# Patient Record
Sex: Male | Born: 1985 | Race: Black or African American | Hispanic: No | Marital: Single | Smoking: Former smoker
Health system: Southern US, Community
[De-identification: ages and names within clinical notes are randomized; demographics above are authoritative.]

## PROBLEM LIST (undated history)

## (undated) DIAGNOSIS — J36 Peritonsillar abscess: Secondary | ICD-10-CM

---

## 2003-01-14 ENCOUNTER — Emergency Department (HOSPITAL_COMMUNITY): Admission: EM | Admit: 2003-01-14 | Discharge: 2003-01-14 | Payer: Self-pay | Admitting: *Deleted

## 2004-03-14 ENCOUNTER — Emergency Department (HOSPITAL_COMMUNITY): Admission: EM | Admit: 2004-03-14 | Discharge: 2004-03-14 | Payer: Self-pay | Admitting: Emergency Medicine

## 2004-03-17 ENCOUNTER — Emergency Department (HOSPITAL_COMMUNITY): Admission: EM | Admit: 2004-03-17 | Discharge: 2004-03-17 | Payer: Self-pay | Admitting: Emergency Medicine

## 2008-04-30 ENCOUNTER — Emergency Department (HOSPITAL_COMMUNITY): Admission: EM | Admit: 2008-04-30 | Discharge: 2008-04-30 | Payer: Self-pay | Admitting: Emergency Medicine

## 2010-12-27 ENCOUNTER — Emergency Department (HOSPITAL_COMMUNITY)
Admission: EM | Admit: 2010-12-27 | Discharge: 2010-12-27 | Disposition: A | Discharge status: Home / Self Care | Payer: Self-pay | Attending: Emergency Medicine | Admitting: Emergency Medicine

## 2010-12-27 DIAGNOSIS — A749 Chlamydial infection, unspecified: Secondary | ICD-10-CM | POA: Insufficient documentation

## 2010-12-27 DIAGNOSIS — A5400 Gonococcal infection of lower genitourinary tract, unspecified: Secondary | ICD-10-CM | POA: Insufficient documentation

## 2010-12-27 LAB — URINALYSIS, ROUTINE W REFLEX MICROSCOPIC
Glucose, UA: NEGATIVE mg/dL
Ketones, ur: NEGATIVE mg/dL
Specific Gravity, Urine: >1.03 — ABNORMAL HIGH (ref 1.005–1.030)
pH: 5.5 (ref 5.0–8.0)

## 2010-12-27 LAB — URINE MICROSCOPIC-ADD ON

## 2010-12-28 LAB — GC/CHLAMYDIA PROBE AMP, GENITAL
Chlamydia, DNA Probe: POSITIVE — AB
GC Probe Amp, Genital: POSITIVE — AB

## 2010-12-28 LAB — URINE CULTURE: Culture  Setup Time: 201206210121

## 2011-03-24 ENCOUNTER — Encounter: Payer: Self-pay | Admitting: *Deleted

## 2011-03-24 ENCOUNTER — Emergency Department (HOSPITAL_COMMUNITY)
Admission: EM | Admit: 2011-03-24 | Discharge: 2011-03-24 | Disposition: A | Discharge status: Home / Self Care | Payer: No Typology Code available for payment source | Attending: Emergency Medicine | Admitting: Emergency Medicine

## 2011-03-24 DIAGNOSIS — IMO0002 Reserved for concepts with insufficient information to code with codable children: Secondary | ICD-10-CM | POA: Insufficient documentation

## 2011-03-24 DIAGNOSIS — T07XXXA Unspecified multiple injuries, initial encounter: Secondary | ICD-10-CM

## 2011-03-24 MED ORDER — METHOCARBAMOL 500 MG PO TABS: ORAL_TABLET | ORAL | Status: DC

## 2011-03-24 MED ORDER — HYDROCODONE-ACETAMINOPHEN 5-500 MG PO TABS: ORAL_TABLET | ORAL | Status: DC

## 2011-03-24 NOTE — ED Notes (Signed)
PA in with pt at this time  
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
## 2011-03-26 NOTE — ED Provider Notes (Signed)
Medical screening examination/treatment/procedure(s) were performed by non-physician practitioner and as supervising physician I was immediately available for consultation/collaboration.   Jackline Castilla L Apolonio Cutting, MD 03/26/11 1804 

## 2011-04-10 LAB — RAPID STREP SCREEN (MED CTR MEBANE ONLY): Streptococcus, Group A Screen (Direct): NEGATIVE

## 2011-04-10 LAB — STREP A DNA PROBE: Group A Strep Probe: NEGATIVE

## 2015-05-23 ENCOUNTER — Emergency Department (HOSPITAL_COMMUNITY)
Admission: EM | Admit: 2015-05-23 | Discharge: 2015-05-23 | Disposition: A | Discharge status: Home / Self Care | Payer: Self-pay | Attending: Emergency Medicine | Admitting: Emergency Medicine

## 2015-05-23 ENCOUNTER — Encounter (HOSPITAL_COMMUNITY): Payer: Self-pay | Admitting: Emergency Medicine

## 2015-05-23 DIAGNOSIS — F17210 Nicotine dependence, cigarettes, uncomplicated: Secondary | ICD-10-CM | POA: Insufficient documentation

## 2015-05-23 DIAGNOSIS — J039 Acute tonsillitis, unspecified: Secondary | ICD-10-CM | POA: Insufficient documentation

## 2015-05-23 LAB — RAPID STREP SCREEN (MED CTR MEBANE ONLY): STREPTOCOCCUS, GROUP A SCREEN (DIRECT): NEGATIVE

## 2015-05-23 MED ORDER — ACETAMINOPHEN 500 MG PO TABS
ORAL_TABLET | ORAL | Status: AC
  Filled 2015-05-23: qty 2

## 2015-05-23 MED ORDER — HYDROCODONE-ACETAMINOPHEN 5-325 MG PO TABS
2.0000 | ORAL_TABLET | ORAL | Status: DC | PRN
Start: 2015-05-23 — End: 2019-04-17

## 2015-05-23 MED ORDER — CLINDAMYCIN PHOSPHATE 900 MG/50ML IV SOLN
900.0000 mg | Freq: Once | INTRAVENOUS | Status: AC
  Administered 2015-05-23: 900 mg via INTRAVENOUS
  Filled 2015-05-23: qty 50

## 2015-05-23 MED ORDER — CLINDAMYCIN HCL 300 MG PO CAPS: ORAL_CAPSULE | ORAL | Status: DC

## 2015-05-23 MED ORDER — SODIUM CHLORIDE 0.9 % IV SOLN
Freq: Once | INTRAVENOUS | Status: AC
  Administered 2015-05-23: 16:00:00 via INTRAVENOUS

## 2015-05-23 MED ORDER — ACETAMINOPHEN 500 MG PO TABS
1000.0000 mg | ORAL_TABLET | Freq: Once | ORAL | Status: AC
  Administered 2015-05-23: 1000 mg via ORAL

## 2015-05-23 NOTE — Discharge Instructions (Signed)

## 2015-05-23 NOTE — ED Notes (Signed)
Pt c/o sore throat and difficulty swallowing.

## 2015-05-23 NOTE — ED Provider Notes (Signed)
CSN: [id number]     Arrival date & time 05/23/15  1340 History  By signing my name below, I, David Barron, attest that this documentation has been prepared under the direction and in the presence of Langston MaskerKaren Sofia, PA-C  Electronically Signed: Jarvis Morganaylor Barron, ED Scribe. 05/23/2015. 2:18 PM.    Chief Complaint  Patient presents with  . Sore Throat    The history is provided by the patient. No language interpreter was used.    HPI Comments: David Barron is a 29 y.o. male who presents to the Emergency Department complaining of constant, moderate, sore throat onset 5 days. He reports associated throat swelling. He states the pain is exacerbated with swallowing and that he has had a hard time eating or drinking liquid. He has not taken anything prior to arrival. He denies any known sick contacts. Pt is an intermittent smoker. He denies any drooling.   History reviewed. No pertinent past medical history. History reviewed. No pertinent past surgical history. History reviewed. No pertinent family history. Social History  Substance Use Topics  . Smoking status: Current Some Day Smoker    Types: Cigarettes  . Smokeless tobacco: Never Used  . Alcohol Use: Yes     Comment: social    Review of Systems  HENT: Positive for sore throat and trouble swallowing.   All other systems reviewed and are negative.     Allergies  Review of patient's allergies indicates no known allergies.  Home Medications   Prior to Admission medications   Medication Sig Start Date End Date Taking? Authorizing Provider  naproxen sodium (ALEVE) 220 MG tablet Take 220 mg by mouth 2 (two) times daily as needed (pain).   Yes Historical Provider, MD  phenol (CHLORASEPTIC) 1.4 % LIQD Use as directed 1 spray in the mouth or throat as needed for throat irritation / pain.   Yes Historical Provider, MD   Triage Vitals: BP 151/96 mmHg  Pulse 121  Temp(Src) 100 F (37.8 C) (Oral)  Resp 18  Ht 6\' 1"  (1.854 m)   Wt 300 lb (136.079 kg)  BMI 39.59 kg/m2  SpO2 97%  Physical Exam  Constitutional: He is oriented to person, place, and time. He appears well-developed and well-nourished. No distress.  HENT:  Head: Normocephalic and atraumatic.  Edematous tonsils, injected, exudated and erythematous Significant amount of throat swelling  Eyes: Conjunctivae and EOM are normal.  Neck: Neck supple. No tracheal deviation present.  Cardiovascular: Normal rate.   Pulmonary/Chest: Effort normal. No respiratory distress.  Musculoskeletal: Normal range of motion.  Lymphadenopathy:    He has cervical adenopathy (anterior).  Neurological: He is alert and oriented to person, place, and time.  Skin: Skin is warm and dry.  Psychiatric: He has a normal mood and affect. His behavior is normal.  Nursing note and vitals reviewed.   ED Course  Procedures (including critical care time)  DIAGNOSTIC STUDIES: Oxygen Saturation is 97% on RA, normal by my interpretation.    COORDINATION OF CARE:  2:20 PM- Will order rapid strep screen. Pt advised of plan for treatment and pt agrees.    Labs Review Labs Reviewed  RAPID STREP SCREEN (NOT AT [facility])  negative strep  Imaging Review No results found. I have personally reviewed and evaluated these images and lab results as part of my medical decision-making.   EKG Interpretation None      MDM   Final diagnoses:  Tonsillitis   Clindamycin Hydrocodone Return here tomorrow for recheck  I personally  performed the services in this documentation, which was scribed in my presence.  The recorded information has been reviewed and considered.   Barnet Pall.   I personally performed the services in this documentation, which was scribed in my presence.  The recorded information has been reviewed and considered.   Barnet Pall.  Lonia Skinner [place], PA-C 05/23/15 1612  Donnetta Hutching, MD 05/23/15 [phone]

## 2015-05-23 NOTE — ED Provider Notes (Signed)
Chief complaint sore throat  Continuing care for David Barron.  Patient's temperature went up from 100-101.8. The patient was treated with 1 g of Tylenol. The temperature then came back down to 100.7. The patient tolerated the IV antibiotics without problem. The patient is able to mobilize secretions without problem. No abscess visualized. The uvula is swollen, but in the midline. There is no increase swelling in the posterior pharynx with the previous examination from Ms. Sophia, P. A. C.. Heart rate 97. No nuchal rigidity appreciated. No rash of the oral pharynx, or the palms.  The patient is encouraged to use soft foods and liquids. He will return to the emergency department on tomorrow for recheck of his throat. Prescription for clindamycin and Norco has been given by Ms. Sophia. The patient is to return to the department sooner if any changes, problems, or concerns.  David Barron Candice Tobey, PA-C 05/23/15 1732  Donnetta HutchingBrian Cook, MD 05/23/15 [phone]

## 2015-05-24 ENCOUNTER — Emergency Department (HOSPITAL_COMMUNITY): Payer: Self-pay

## 2015-05-24 ENCOUNTER — Emergency Department (HOSPITAL_COMMUNITY)
Admission: EM | Admit: 2015-05-24 | Discharge: 2015-05-24 | Disposition: A | Discharge status: Home / Self Care | Payer: Self-pay | Attending: Emergency Medicine | Admitting: Emergency Medicine

## 2015-05-24 ENCOUNTER — Encounter (HOSPITAL_COMMUNITY): Payer: Self-pay | Admitting: Emergency Medicine

## 2015-05-24 DIAGNOSIS — J029 Acute pharyngitis, unspecified: Secondary | ICD-10-CM

## 2015-05-24 DIAGNOSIS — J039 Acute tonsillitis, unspecified: Secondary | ICD-10-CM | POA: Insufficient documentation

## 2015-05-24 DIAGNOSIS — F17210 Nicotine dependence, cigarettes, uncomplicated: Secondary | ICD-10-CM | POA: Insufficient documentation

## 2015-05-24 LAB — CBC WITH DIFFERENTIAL/PLATELET
BASOS PCT: 1 %
Basophils Absolute: 0.1 10*3/uL (ref 0.0–0.1)
EOS ABS: 0 10*3/uL (ref 0.0–0.7)
Eosinophils Relative: 0 %
HCT: 41.8 % (ref 39.0–52.0)
HEMOGLOBIN: 13.6 g/dL (ref 13.0–17.0)
LYMPHS ABS: 2.3 10*3/uL (ref 0.7–4.0)
Lymphocytes Relative: 22 %
MCH: 27.9 pg (ref 26.0–34.0)
MCHC: 32.5 g/dL (ref 30.0–36.0)
MCV: 85.7 fL (ref 78.0–100.0)
MONO ABS: 1.3 10*3/uL — AB (ref 0.1–1.0)
MONOS PCT: 13 %
NEUTROS PCT: 65 %
Neutro Abs: 6.7 10*3/uL (ref 1.7–7.7)
Platelets: 299 10*3/uL (ref 150–400)
RBC: 4.88 MIL/uL (ref 4.22–5.81)
RDW: 13.5 % (ref 11.5–15.5)
WBC: 10.4 10*3/uL (ref 4.0–10.5)

## 2015-05-24 LAB — BASIC METABOLIC PANEL
Anion gap: 11 (ref 5–15)
BUN: 11 mg/dL (ref 6–20)
CHLORIDE: 97 mmol/L — AB (ref 101–111)
CO2: 27 mmol/L (ref 22–32)
CREATININE: 1.19 mg/dL (ref 0.61–1.24)
Calcium: 9.4 mg/dL (ref 8.9–10.3)
GFR calc non Af Amer: >60 mL/min (ref >60)
Glucose, Bld: 100 mg/dL — ABNORMAL HIGH (ref 65–99)
Potassium: 3.5 mmol/L (ref 3.5–5.1)
SODIUM: 135 mmol/L (ref 135–145)

## 2015-05-24 MED ORDER — HYDROMORPHONE HCL 1 MG/ML IJ SOLN
1.0000 mg | Freq: Once | INTRAMUSCULAR | Status: AC
Start: 2015-05-24 — End: 2015-05-24
  Administered 2015-05-24: 1 mg via INTRAVENOUS
  Filled 2015-05-24: qty 1

## 2015-05-24 MED ORDER — SODIUM CHLORIDE 0.9 % IV SOLN
INTRAVENOUS | Status: DC
  Administered 2015-05-24: 19:00:00 via INTRAVENOUS

## 2015-05-24 MED ORDER — IOHEXOL 300 MG/ML  SOLN
75.0000 mL | Freq: Once | INTRAMUSCULAR | Status: AC | PRN
  Administered 2015-05-24: 75 mL via INTRAVENOUS

## 2015-05-24 MED ORDER — ONDANSETRON HCL 4 MG/2ML IJ SOLN
4.0000 mg | Freq: Once | INTRAMUSCULAR | Status: AC
  Administered 2015-05-24: 4 mg via INTRAVENOUS
  Filled 2015-05-24: qty 2

## 2015-05-24 MED ORDER — SODIUM CHLORIDE 0.9 % IV BOLUS (SEPSIS)
1000.0000 mL | Freq: Once | INTRAVENOUS | Status: AC
  Administered 2015-05-24: 1000 mL via INTRAVENOUS

## 2015-05-24 MED ORDER — DEXAMETHASONE SODIUM PHOSPHATE 4 MG/ML IJ SOLN
10.0000 mg | Freq: Once | INTRAMUSCULAR | Status: AC
  Administered 2015-05-24: 10 mg via INTRAVENOUS
  Filled 2015-05-24: qty 3

## 2015-05-24 MED ORDER — CLINDAMYCIN PHOSPHATE 900 MG/50ML IV SOLN
900.0000 mg | Freq: Once | INTRAVENOUS | Status: AC
  Administered 2015-05-24: 900 mg via INTRAVENOUS
  Filled 2015-05-24: qty 50

## 2015-05-24 MED ORDER — FENTANYL CITRATE (PF) 100 MCG/2ML IJ SOLN
50.0000 ug | Freq: Once | INTRAMUSCULAR | Status: AC
  Administered 2015-05-24: 50 ug via INTRAVENOUS
  Filled 2015-05-24: qty 2

## 2015-05-24 NOTE — Discharge Instructions (Signed)
Hydrate yourself is much as possible. Push fluids small amounts frequently. Continue take your antibodies continue take pain medicine. Follow-up tomorrow for recheck it sometime in the emergency department. Return earlier for any breathing problems or making any noises when you breathe.

## 2015-05-24 NOTE — ED Notes (Signed)
Dr. Deretha EmoryZackowski informed of pt's temperature.

## 2015-05-24 NOTE — ED Provider Notes (Signed)
CSN: [id number]     Arrival date & time 05/24/15  1606 History   First MD Initiated Contact with Patient 05/24/15 1636     Chief Complaint  Patient presents with  . Oral Swelling     (Consider location/radiation/quality/duration/timing/severity/associated sxs/prior Treatment) The history is provided by the patient.   29 year old male seen yesterday for sore throat. Rapid strep was negative. Throat culture pending. Patient started on hydrocodone and clindamycin. Patient returns today with symptoms feeling worse. Having some difficulty swallowing occasionally has to spit out his saliva. Not making any noises when he breathes. Patient overall feels as if he is worse. Still with fevers.  History reviewed. No pertinent past medical history. History reviewed. No pertinent past surgical history. History reviewed. No pertinent family history. Social History  Substance Use Topics  . Smoking status: Current Some Day Smoker    Types: Cigarettes  . Smokeless tobacco: Never Used  . Alcohol Use: Yes     Comment: social    Review of Systems  Constitutional: Positive for fever.  HENT: Positive for sore throat, trouble swallowing and voice change. Negative for congestion.   Eyes: Negative for redness.  Respiratory: Negative for shortness of breath.   Cardiovascular: Negative for chest pain.  Gastrointestinal: Negative for nausea, vomiting and abdominal pain.  Genitourinary: Negative for dysuria.  Musculoskeletal: Positive for neck pain.  Skin: Negative for rash.  Neurological: Negative for headaches.  Hematological: Does not bruise/bleed easily.  Psychiatric/Behavioral: Negative for confusion.      Allergies  Review of patient's allergies indicates no known allergies.  Home Medications   Prior to Admission medications   Medication Sig Start Date End Date Taking? Authorizing Provider  clindamycin (CLEOCIN) 300 MG capsule One tablet 4 times a day Patient taking differently: Take 300  mg by mouth 4 (four) times daily. 7 day course starting on 05/23/2015 05/23/15  Yes Elson Areas, PA-C  HYDROcodone-acetaminophen (NORCO/VICODIN) 5-325 MG tablet Take 2 tablets by mouth every 4 (four) hours as needed. 05/23/15  Yes Lonia Skinner Sofia, PA-C  naproxen sodium (ALEVE) 220 MG tablet Take 220 mg by mouth 2 (two) times daily as needed (pain).   Yes Historical Provider, MD  phenol (CHLORASEPTIC) 1.4 % LIQD Use as directed 1 spray in the mouth or throat as needed for throat irritation / pain.   Yes Historical Provider, MD   BP 155/96 mmHg  Pulse 94  Temp(Src) 101.2 F (38.4 C) (Oral)  Resp 23  Ht  (1.88 m)  Wt 300 lb (136.079 kg)  BMI 38.50 kg/m2  SpO2 92% Physical Exam  Constitutional: He is oriented to person, place, and time. He appears well-developed and well-nourished. No distress.  HENT:  Head: Normocephalic and atraumatic.  Mouth/Throat: Oropharynx is clear and moist. No oropharyngeal exudate.  But posterior pharynx hard to see completely. There is erythema and swelling.   Neck: Normal range of motion.  Cardiovascular: Normal rate, regular rhythm and normal heart sounds.   Pulmonary/Chest: Effort normal and breath sounds normal. No respiratory distress. He has no wheezes. He has no rales.  No stridor.  Abdominal: Soft. There is no tenderness.  Musculoskeletal: Normal range of motion.  Lymphadenopathy:    He has cervical adenopathy.  Neurological: He is alert and oriented to person, place, and time. No cranial nerve deficit. He exhibits normal muscle tone. Coordination normal.  Skin: Skin is warm. No rash noted.  Nursing note and vitals reviewed.   ED Course  Procedures (including critical care time)  Labs Review Labs Reviewed  CBC WITH DIFFERENTIAL/PLATELET - Abnormal; Notable for the following:    Monocytes Absolute 1.3 (*)    All other components within normal limits  BASIC METABOLIC PANEL - Abnormal; Notable for the following:    Chloride 97 (*)     Glucose, Bld 100 (*)    All other components within normal limits   Results for orders placed or performed during the hospital encounter of 05/24/15  CBC with Differential/Platelet  Result Value Ref Range   WBC 10.4 4.0 - 10.5 K/uL   RBC 4.88 4.22 - 5.81 MIL/uL   Hemoglobin 13.6 13.0 - 17.0 g/dL   HCT 57.8 46.9 - 62.9 %   MCV 85.7 78.0 - 100.0 fL   MCH 27.9 26.0 - 34.0 pg   MCHC 32.5 30.0 - 36.0 g/dL   RDW 52.8 41.3 - 24.4 %   Platelets 299 150 - 400 K/uL   Neutrophils Relative % 65 %   Neutro Abs 6.7 1.7 - 7.7 K/uL   Lymphocytes Relative 22 %   Lymphs Abs 2.3 0.7 - 4.0 K/uL   Monocytes Relative 13 %   Monocytes Absolute 1.3 (H) 0.1 - 1.0 K/uL   Eosinophils Relative 0 %   Eosinophils Absolute 0.0 0.0 - 0.7 K/uL   Basophils Relative 1 %   Basophils Absolute 0.1 0.0 - 0.1 K/uL  Basic metabolic panel  Result Value Ref Range   Sodium 135 135 - 145 mmol/L   Potassium 3.5 3.5 - 5.1 mmol/L   Chloride 97 (L) 101 - 111 mmol/L   CO2 27 22 - 32 mmol/L   Glucose, Bld 100 (H) 65 - 99 mg/dL   BUN 11 6 - 20 mg/dL   Creatinine, Ser 0.10 0.61 - 1.24 mg/dL   Calcium 9.4 8.9 - 27.2 mg/dL   GFR calc non Af Amer >60 >60 mL/min   GFR calc Af Amer >60 >60 mL/min   Anion gap 11 5 - 15     Imaging Review Ct Soft Tissue Neck W Contrast  05/24/2015  CLINICAL DATA:  Throat swelling.  Difficulty swallowing. EXAM: CT NECK WITH CONTRAST TECHNIQUE: Multidetector CT imaging of the neck was performed using the standard protocol following the bolus administration of intravenous contrast. CONTRAST:  75mL OMNIPAQUE IOHEXOL 300 MG/ML  SOLN COMPARISON:  None. FINDINGS: Pharynx and larynx: Swelling of the soft palate. Mild adenoidal prominence and considerable prominence of the palatine tonsils with a 1.9 by 1.2 by 0.7 cm hypodensity in the right tonsillar pillar potentially reflecting phlegmon orally abscess. The lingual tonsils are also hypertrophic/ prominent. The epiglottis and ariepiglottic folds appear  normal. Although the nasopharyngeal airway is close due to swelling, the or pharyngeal airway still patent on image 47 series 4. Swelling along the right quadrangular membrane/posterior pharyngeal wall with mild effacement of the right piriform sinus. The posterior pharyngeal wall on the right is hypodense suspicious for phlegmon tracking within the right posterior pharyngeal wall. Salivary glands: Unremarkable Thyroid: Unremarkable Lymph nodes: Reactive adenopathy bilaterally, left station 2 lymph node 1.9 cm in short axis on image 48 series 2, right station 2 lymph node 2.1 cm in short axis on image 46 series 2. Vascular: Unremarkable Limited intracranial: Unremarkable Visualized orbits: Unremarkable Mastoids and visualized paranasal sinuses: Mild chronic right maxillary sinusitis. Skeleton: Large cavity of tooth #3 with periapical lucency. Upper chest: Unremarkable IMPRESSION: 1. Right tonsillar pillar abscess versus prominent phlegmon. Hypodense swelling in the right posterior pharyngeal wall compatible with phlegmon tracking caudad. Prominent  swelling of the soft palate and tonsillar pillars causing obstruction of the nasopharyngeal airway. Or pharyngeal airway is still patent. Mildly effaced right piriform sinus due to the posterior pharyngeal wall swelling. Reactive adenopathy in the neck. Otolaryngology consultation may be warranted. Careful monitoring for stridor warranted. 2. Note is also made of a large cavity and probable periapical abscess involving tooth #3. I do not see a direct connection between this in the pharyngeal findings. 3. Hypertrophic lingual tonsils. 4. Mild chronic right maxillary sinusitis. Electronically Signed   By: Gaylyn RongWalter  Liebkemann M.D.   On: 05/24/2015 19:05   I have personally reviewed and evaluated these images and lab results as part of my medical decision-making.   EKG Interpretation None      MDM   Final diagnoses:  Pharyngitis  Tonsillitis, phlegmonous    CT  scan results discussed with on call ear nose and throat at [place] Dr. Annalee GentaShoemaker. No distinct evidence of abscess cavity to suggest peritonsillar abscess at this time. Possibly could develop. ENT recommends 10 mg of Decadron IV clindamycin which I reordered. Continuing his current antibiotics orally at home and his pain medicine and close follow-up. Patient will return here for for recheck tomorrow. Patient will come in earlier if he develops any stridor. Ear nose and throat stated that is a good chance that this may not form into an abscess if it does develop into an abscess then patient will need to be seen by them.  Patient hydrated here IV received 2 L of normal saline. Patient received pain medicine received 900 mg of IV clindamycin and 10 mg of Decadron.  Patient without any airway compromise currently no stridor.    Vanetta MuldersScott Ojani Berenson, MD 05/24/15 2022

## 2015-05-24 NOTE — ED Notes (Signed)
Pt states that he was here yesterday for swelling of the throat and received iv antibiotics.  States that he has begun his prescription and throat is worse.  Unable to swallow secretions.

## 2015-05-24 NOTE — ED Notes (Signed)
Airway patent, no stridor heard.

## 2015-05-25 ENCOUNTER — Encounter (HOSPITAL_COMMUNITY): Payer: Self-pay | Admitting: Emergency Medicine

## 2015-05-25 ENCOUNTER — Emergency Department (HOSPITAL_COMMUNITY)
Admission: EM | Admit: 2015-05-25 | Discharge: 2015-05-25 | Disposition: A | Discharge status: Home / Self Care | Payer: BLUE CROSS/BLUE SHIELD | Attending: Emergency Medicine | Admitting: Emergency Medicine

## 2015-05-25 DIAGNOSIS — J36 Peritonsillar abscess: Secondary | ICD-10-CM | POA: Insufficient documentation

## 2015-05-25 DIAGNOSIS — F17210 Nicotine dependence, cigarettes, uncomplicated: Secondary | ICD-10-CM | POA: Insufficient documentation

## 2015-05-25 DIAGNOSIS — J029 Acute pharyngitis, unspecified: Secondary | ICD-10-CM | POA: Diagnosis present

## 2015-05-25 LAB — CULTURE, GROUP A STREP: STREP A CULTURE: NEGATIVE

## 2015-05-25 NOTE — ED Provider Notes (Signed)
CSN: [id number]     Arrival date & time 05/25/15  1404 History   First MD Initiated Contact with Patient 05/25/15 1411     Chief Complaint  Patient presents with  . Sore Throat    follow up     (Consider location/radiation/quality/duration/timing/severity/associated sxs/prior Treatment) The history is provided by the patient.   David Barron is a 29 y.o. male  Presenting for a recheck of his now 8 day history of sore throat.  He was seen here 2 days ago and was treated for tonsillitis with clindamycin, then returned yesterday due to increased pain and swelling of his posterior pharynx, having difficulty eating and drinking but has been able to handle his saliva.  CT imaging revealed no definite peritonsillar abscess, but possible early abscess vs phlegmon involving the right tonsil.  He was given decadron and an IV course of clindamycin and advised to return today for a recheck.  He reports feeling improved at this time, still with posterior pharyngeal swelling on the right, feels less swollen on the left and is able to more easily swallow and eat soft foods.  He denies fever today.  He is taking his antibiotic and had his last hydrocodone just prior to arrival.   History reviewed. No pertinent past medical history. History reviewed. No pertinent past surgical history. History reviewed. No pertinent family history. Social History  Substance Use Topics  . Smoking status: Current Some Day Smoker    Types: Cigarettes  . Smokeless tobacco: Never Used  . Alcohol Use: Yes     Comment: social    Review of Systems  Constitutional: Negative for fever and chills.  HENT: Positive for sore throat, trouble swallowing and voice change. Negative for congestion, ear pain, rhinorrhea and sinus pressure.        Negative except as mentioned in HPI.    Eyes: Negative for discharge.  Respiratory: Negative for cough, shortness of breath, wheezing and stridor.   Cardiovascular: Negative for chest  pain.  Gastrointestinal: Negative for abdominal pain.  Genitourinary: Negative.       Allergies  Review of patient's allergies indicates no known allergies.  Home Medications   Prior to Admission medications   Medication Sig Start Date End Date Taking? Authorizing Provider  clindamycin (CLEOCIN) 300 MG capsule One tablet 4 times a day Patient taking differently: Take 300 mg by mouth 4 (four) times daily. 7 day course starting on 05/23/2015 05/23/15  Yes Elson AreasLeslie K Sofia, PA-C  HYDROcodone-acetaminophen (NORCO/VICODIN) 5-325 MG tablet Take 2 tablets by mouth every 4 (four) hours as needed. Patient taking differently: Take 2 tablets by mouth every 4 (four) hours as needed for moderate pain.  05/23/15  Yes Lonia SkinnerLeslie K Sofia, PA-C  naproxen sodium (ALEVE) 220 MG tablet Take 220 mg by mouth 2 (two) times daily as needed (pain).   Yes Historical Provider, MD  phenol (CHLORASEPTIC) 1.4 % LIQD Use as directed 1 spray in the mouth or throat as needed for throat irritation / pain.   Yes Historical Provider, MD   BP 147/86 mmHg  Pulse 66  Temp(Src) 97.5 F (36.4 C) (Oral)  Resp 16  Ht 6\' 2"  (1.88 m)  Wt 300 lb (136.079 kg)  BMI 38.50 kg/m2  SpO2 99% Physical Exam  Constitutional: He is oriented to person, place, and time. He appears well-developed and well-nourished.  HENT:  Head: Normocephalic and atraumatic.  Right Ear: Tympanic membrane and ear canal normal.  Left Ear: Tympanic membrane and ear canal normal.  Nose: No mucosal edema or rhinorrhea.  Mouth/Throat: Mucous membranes are normal. No uvula swelling. Posterior oropharyngeal edema, posterior oropharyngeal erythema and tonsillar abscesses present. No oropharyngeal exudate.  Right peritonsillar soft palette edema, leftward uvula deviation.  No drainage.  Voice is somewhat muffled.  He is swallowing his saliva.  Eyes: Conjunctivae are normal.  Cardiovascular: Normal rate and normal heart sounds.   Pulmonary/Chest: Effort normal. No  stridor. No respiratory distress. He has no wheezes. He has no rales.  Abdominal: Soft. There is no tenderness.  Musculoskeletal: Normal range of motion.  Neurological: He is alert and oriented to person, place, and time.  Skin: Skin is warm and dry. No rash noted.  Psychiatric: He has a normal mood and affect.    ED Course  Procedures (including critical care time)    Imaging Review  CT imaging from yesterdays visit reviewed.  MDM   Final diagnoses:  Peritonsillar abscess    Pt also seen by Dr. Patria Mane who performed a needle aspiration with removal of purulence from the right peritonsillar abscess, refer to his note for detail.  Pt felt improved after procedure, less edema noted.  He tolerated the procedure well.  Advised to continue with abx and pain medication, returning here for a recheck if sx do not continue to improve daily now that this abscess has been released.  Advised recheck immediately for any worsened pain, swelling or difficulty swallowing or breathing.    Burgess Amor, PA-C 05/26/15 1610  Azalia Bilis, MD 05/26/15 [phone]

## 2015-05-25 NOTE — ED Notes (Signed)
Here for follow up per pt.  Told to come back here for follow up visit.  Sore throat has improved.

## 2015-05-25 NOTE — Discharge Instructions (Signed)
Peritonsillar Abscess A peritonsillar abscess is a collection of yellowish-white fluid (pus) in the back of the throat behind the tonsils. It usually occurs when an infection of the throat or tonsils (tonsillitis) spreads into the tissues around the tonsils. CAUSES The infection that leads to a peritonsillar abscess is usually caused by streptococcal bacteria.  SIGNS AND SYMPTOMS  Sore throat, often with pain on just one side.  Swelling and tenderness of the glands (lymph nodes) in the neck.  Difficulty swallowing.  Difficulty opening your mouth.  Fever.  Chills.  Drooling because of difficulty swallowing saliva.  Headache.  Changes in your voice.  Bad breath. DIAGNOSIS Your health care provider will take your medical history and do a physical exam. Imaging tests may be done, such as an ultrasound or CT scan. A sample of pus may be removed from the abscess using a needle (needle aspiration) or by swabbing the back of your throat. This sample will be sent to a lab for testing. TREATMENT Treatment usually involves draining the pus from the abscess. This may be done through needle aspiration or by making an incision in the abscess. You will also likely need to take antibiotic medicine. HOME CARE INSTRUCTIONS  Rest as much as possible and get plenty of sleep.  Take medicines only as directed by your health care provider.  If you were prescribed an antibiotic medicine, finish it all even if you start to feel better.  If your abscess was drained by your health care provider, gargle with a mixture of salt and warm water:  Mix 1 tsp of salt in 8 oz of warm water.  Gargle with this mixture four times per day or as needed for comfort.  Do not swallow this mixture.  Drink plenty of fluids.  While your throat is sore, eat soft or liquid foods, such as frozen ice pops and ice cream.  Keep all follow-up visits as directed by your health care provider. This is important. SEEK  MEDICAL CARE IF:  You have increased pain, swelling, redness, or drainage in your throat.  You develop a headache, a lack of energy (lethargy), or generalized feelings of illness.  You have a fever.  You feel dizzy.  You have difficulty swallowing or eating.  You show signs of becoming dehydrated, such as:  Light-headedness when standing.  Decreased urine output.  A fast heart rate.  Dry mouth. SEEK IMMEDIATE MEDICAL CARE IF:   You have difficulty talking or breathing, or you find it easier to breathe when you lean forward.  You are coughing up blood or vomiting blood.  You have severe throat pain that is not helped by medicines.  You start to drool.   This information is not intended to replace advice given to you by your health care provider. Make sure you discuss any questions you have with your health care provider.   Document Released: 06/25/2005 Document Revised: 07/16/2014 Document Reviewed: 02/08/2014 Elsevier Interactive Patient Education 2016 [employer]   Take your antibiotics until completed.

## 2015-06-04 ENCOUNTER — Emergency Department (HOSPITAL_COMMUNITY)
Admission: EM | Admit: 2015-06-04 | Discharge: 2015-06-04 | Disposition: A | Discharge status: Home / Self Care | Payer: BLUE CROSS/BLUE SHIELD | Attending: Emergency Medicine | Admitting: Emergency Medicine

## 2015-06-04 ENCOUNTER — Encounter (HOSPITAL_COMMUNITY): Payer: Self-pay | Admitting: *Deleted

## 2015-06-04 DIAGNOSIS — J36 Peritonsillar abscess: Secondary | ICD-10-CM | POA: Insufficient documentation

## 2015-06-04 HISTORY — DX: Peritonsillar abscess: J36

## 2015-06-04 MED ORDER — OXYCODONE-ACETAMINOPHEN 5-325 MG PO TABS: 1.0000 | ORAL_TABLET | Freq: Four times a day (QID) | ORAL | Status: DC | PRN

## 2015-06-04 MED ORDER — DEXAMETHASONE SODIUM PHOSPHATE 10 MG/ML IJ SOLN
20.0000 mg | Freq: Once | INTRAMUSCULAR | Status: AC
  Administered 2015-06-04: 20 mg via INTRAVENOUS

## 2015-06-04 MED ORDER — DEXAMETHASONE SODIUM PHOSPHATE 10 MG/ML IJ SOLN
INTRAMUSCULAR | Status: AC
  Filled 2015-06-04: qty 2

## 2015-06-04 MED ORDER — CLINDAMYCIN HCL 300 MG PO CAPS: 300.0000 mg | ORAL_CAPSULE | Freq: Four times a day (QID) | ORAL | Status: DC

## 2015-06-04 MED ORDER — CLINDAMYCIN PHOSPHATE 900 MG/50ML IV SOLN
900.0000 mg | Freq: Once | INTRAVENOUS | Status: AC
  Administered 2015-06-04: 900 mg via INTRAVENOUS
  Filled 2015-06-04: qty 50

## 2015-06-04 MED ORDER — ONDANSETRON HCL 4 MG/2ML IJ SOLN
4.0000 mg | Freq: Once | INTRAMUSCULAR | Status: AC
  Administered 2015-06-04: 4 mg via INTRAVENOUS
  Filled 2015-06-04: qty 2

## 2015-06-04 MED ORDER — MORPHINE SULFATE (PF) 4 MG/ML IV SOLN
6.0000 mg | Freq: Once | INTRAVENOUS | Status: AC
  Administered 2015-06-04: 6 mg via INTRAMUSCULAR
  Filled 2015-06-04: qty 2

## 2015-06-04 MED ORDER — MORPHINE SULFATE (PF) 4 MG/ML IV SOLN
4.0000 mg | Freq: Once | INTRAVENOUS | Status: AC
  Administered 2015-06-04: 4 mg via INTRAVENOUS
  Filled 2015-06-04: qty 1

## 2015-06-04 NOTE — Consult Note (Signed)
Reason for Consult: Right peritonsillar abscess Referring Physician: Doug Sou, MD  HPI:  David Barron is an 29 y.o. male who presents to the [facility] Emergency Department complaining of recurrent right-sided throat pain for 2 days. Patient was seen there 3 days last week for the same symptoms, and was diagnosed with a peritonsillar abscess. He was given IV clindamycin and Decadron on 05/24/2015 and completed a course by mouth earlier this week.  He states the symptoms appeared to be improving initially but have now gotten much worse. He states he has been drinking fluids until this morning.  He states that his voice has been muffled, but he has been handling his secretions well. He denies fever, vomiting, cough or congestion.  Past Medical History  Diagnosis Date  . Peritonsillar abscess     History reviewed. No pertinent past surgical history.  No family history on file.  Social History:  reports that he has been smoking Cigarettes.  He has never used smokeless tobacco. He reports that he drinks alcohol. He reports that he does not use illicit drugs.  Allergies: No Known Allergies  Prior to Admission medications   Medication Sig Start Date End Date Taking? Authorizing Provider  ibuprofen (ADVIL,MOTRIN) 200 MG tablet Take 400 mg by mouth every 6 (six) hours as needed for moderate pain.   Yes Historical Provider, MD  naproxen sodium (ALEVE) 220 MG tablet Take 220 mg by mouth 2 (two) times daily as needed (pain).   Yes Historical Provider, MD  phenol (CHLORASEPTIC) 1.4 % LIQD Use as directed 1 spray in the mouth or throat as needed for throat irritation / pain.   Yes Historical Provider, MD  clindamycin (CLEOCIN) 300 MG capsule One tablet 4 times a day Patient not taking: Reported on 06/04/2015 05/23/15   Elson Areas, PA-C  HYDROcodone-acetaminophen (NORCO/VICODIN) 5-325 MG tablet Take 2 tablets by mouth every 4 (four) hours as needed. Patient not taking: Reported on 06/04/2015  05/23/15   Elson Areas, PA-C    No results found for this or any previous visit (from the past 48 hour(s)).  No results found.  Review of Systems  Constitutional: Negative for fever, chills, activity change and appetite change.  HENT: Positive for sore throat, trouble swallowing and voice change. Negative for congestion, drooling, ear pain and facial swelling.  Respiratory: Negative for shortness of breath and stridor.  Gastrointestinal: Negative for nausea, vomiting and abdominal pain.  Musculoskeletal: Negative for neck pain and neck stiffness.  Skin: Negative for color change and rash.  Neurological: Negative for dizziness, facial asymmetry, speech difficulty, numbness and headaches.  Hematological: Negative for adenopathy.  All other systems reviewed and are negative.  Blood pressure 158/85, pulse 100, temperature 98.5 F (36.9 C), temperature source Oral, resp. rate 20, height 6' (1.829 m), weight 144.244 kg (318 lb), SpO2 94 %. Physical Exam  Constitutional: He is oriented to person, place, and time. He appears well-developed and well-nourished. No distress.  Head: Normocephalic and atraumatic.  Right Ear: Tympanic membrane and ear canal normal.  Left Ear: Tympanic membrane and ear canal normal.  Mouth/Throat: Uvula deviated to the left. Significant right peritonsillar edema. Neck: Normal range of motion. Neck supple. No tracheal deviation present.  Cardiovascular: Normal rate, regular rhythm and normal heart sounds.  Pulmonary/Chest:  No stridor. No respiratory distress.  Neurological: He is alert and oriented to person, place, and time. He exhibits normal muscle tone. Coordination normal.  Skin: Skin is warm and dry.  Nursing note and  vitals reviewed.  Procedure: Incision and drainage of the right peritonsillar abscess.   Anesthesia: local anesthesia with 1% lidocaine with epinephrine.   Description: The patient is placed upright on the hospital bed.  After adequate  local anesthesia is achieved, an 18-G spinal needle is used to make multiple passes over the right superior tonsillar pole.  Approximately 8 cc of purulent fluid was evacuated.  The abscess cavity was incised opened with the spinal needle tip.  The patient tolerated the procedure well.    Assessment/Plan: Right peritonsillar abscess. I&D performed in ER under local anesthesia. Pt will be discharged home on clinda 300mg  QID for 10 days. Pt may f/u with me at my [place] clinic next week.  Youcef Klas,SUI W 06/04/2015, 6:48 PM

## 2015-06-04 NOTE — ED Provider Notes (Signed)
Complains of right-sided throat pain onset 2 days ago. Pain is worse with swallowing. On exam patient is alert and nontoxic. Hot potato voice. Handling secretions well .Oropharynx swollen and reddened right side. Uvula shifted to left.  David SouSam Tyee Vandevoorde, MD 06/04/15 [phone]

## 2015-06-04 NOTE — ED Notes (Signed)
ENT cart bedside

## 2015-06-04 NOTE — ED Provider Notes (Signed)
CSN: [id number]     Arrival date & time 06/04/15  4098 History   First MD Initiated Contact with Patient 06/04/15 1103     Chief Complaint  Patient presents with  . Sore Throat     (Consider location/radiation/quality/duration/timing/severity/associated sxs/prior Treatment) HPI   David Barron is a 29 y.o. male who presents to the Emergency Department complaining of recurrent right-sided throat pain for 2 days. Patient was seen here 3 days last week for same and diagnosed with a peritonsillar abscess. He is being given IV clindamycin and Decadron on 05/24/2015 and completed a course of by mouth clindamycin earlier this week. He states that on his last visit here the abscess was drained by the emergency room physician. He states the symptoms appeared to be improving initially but have now gotten much worse. He states he has been drinking fluids until this morning. He felt as though he was improving with the antibiotics. He states that his voice has been muffled, but he has been handling his secretions well. He denies fever, vomiting, cough or congestion.  Past Medical History  Diagnosis Date  . Peritonsillar abscess    History reviewed. No pertinent past surgical history. No family history on file. Social History  Substance Use Topics  . Smoking status: Current Some Day Smoker    Types: Cigarettes  . Smokeless tobacco: Never Used  . Alcohol Use: Yes     Comment: social    Review of Systems  Constitutional: Negative for fever, chills, activity change and appetite change.  HENT: Positive for sore throat, trouble swallowing and voice change. Negative for congestion, drooling, ear pain and facial swelling.   Respiratory: Negative for shortness of breath and stridor.   Gastrointestinal: Negative for nausea, vomiting and abdominal pain.  Musculoskeletal: Negative for neck pain and neck stiffness.  Skin: Negative for color change and rash.  Neurological: Negative for dizziness,  facial asymmetry, speech difficulty, numbness and headaches.  Hematological: Negative for adenopathy.  All other systems reviewed and are negative.     Allergies  Review of patient's allergies indicates no known allergies.  Home Medications   Prior to Admission medications   Medication Sig Start Date End Date Taking? Authorizing Provider  clindamycin (CLEOCIN) 300 MG capsule One tablet 4 times a day Patient taking differently: Take 300 mg by mouth 4 (four) times daily. 7 day course starting on 05/23/2015 05/23/15   Elson Areas, PA-C  HYDROcodone-acetaminophen (NORCO/VICODIN) 5-325 MG tablet Take 2 tablets by mouth every 4 (four) hours as needed. Patient taking differently: Take 2 tablets by mouth every 4 (four) hours as needed for moderate pain.  05/23/15   Elson Areas, PA-C  naproxen sodium (ALEVE) 220 MG tablet Take 220 mg by mouth 2 (two) times daily as needed (pain).    Historical Provider, MD  phenol (CHLORASEPTIC) 1.4 % LIQD Use as directed 1 spray in the mouth or throat as needed for throat irritation / pain.    Historical Provider, MD   BP 157/95 mmHg  Pulse 101  Temp(Src) 97.7 F (36.5 C) (Oral)  Resp 16  Ht 6' (1.829 m)  Wt 144.244 kg  BMI 43.12 kg/m2 Physical Exam  Constitutional: He is oriented to person, place, and time. He appears well-developed and well-nourished. No distress.  HENT:  Head: Normocephalic and atraumatic.  Right Ear: Tympanic membrane and ear canal normal.  Left Ear: Tympanic membrane and ear canal normal.  Mouth/Throat: Uvula is midline and mucous membranes are normal. No trismus  in the jaw. No uvula swelling. Tonsillar abscesses present.  Large amount of edema to the right peritonsillar region extending to the soft palate. Uvula is deviated leftward. Hot potato voice is present. Patient is handling secretions well  Neck: Normal range of motion. Neck supple. No tracheal deviation present.  Cardiovascular: Normal rate, regular rhythm and  normal heart sounds.   Pulmonary/Chest: Effort normal and breath sounds normal. No stridor. No respiratory distress.  Abdominal: There is no splenomegaly. There is no tenderness.  Musculoskeletal: Normal range of motion.  Lymphadenopathy:    He has no cervical adenopathy.  Neurological: He is alert and oriented to person, place, and time. He exhibits normal muscle tone. Coordination normal.  Skin: Skin is warm and dry.  Nursing note and vitals reviewed.   ED Course  Procedures (including critical care time)  Previous ED notes and imaging reviewed and considered in my MDM.   MDM   Final diagnoses:  Peritonsillar abscess     11:45 AM consult Dr. Suszanne Connerseoh, who recommends giving patient 20 mg of IV Decadron and 900 mg of IV clindamycin and 2 hour observation.  I discussed treatment plan with the patient and Dr. Ethelda ChickJacubowitz  Patient feeling a little better after IV morphine. Airway remains patent.  Continues to handle secretions without difficulty  1440  Dr. Suszanne Connerseoh called back, pt continues to have pain with swallowing.  Dr. Suszanne Connerseoh advised [facility] the [facility]. Patient [facility].    1530 Spoke with Dr. Cyndie ChimeNguyen and advised her of patient's pending arrival.   [name], PA-C 06/04/15 1641  [name], MD 06/05/15 [phone]

## 2015-06-04 NOTE — ED Notes (Signed)
Pt states he was here 3 days in a row a week ago. Pt states symptoms got better and started back on Thursday. Severe swelling to right tonsil with uvula deviated to the left, muffled voice. Previous dx noted of peritonsillar abscess on last visit.

## 2015-06-04 NOTE — ED Notes (Signed)
Pt is in stable condition upon d/c and ambulates from ED. 

## 2015-06-04 NOTE — ED Provider Notes (Signed)
29 Y[facility] at the request of Dr. Suszanne Connerseoh for drainage of PTA. Pt seen earlier today after several days of worsening sore throat with swelling. Pt evaluated in ED, please see previous provider note for full Hand P. Dr. Suszanne Connerseoh  Made aware of patient. He personally evaluated the patient here in the ED with successful drainage of the peritonsillar abscess. Patient was treated in the ED for pain management, he was discharged home with pain medication, clindamycin 300 mg 4 times daily for 10 days at the request of Dr. Suszanne Connerseoh. Patient was given follow-up information for Dr. Duard Larseneel's office he was instructed to follow-up with him on Thursday of this week for reevaluation. Patient was given strict return precautions, he was discharged home in the care of his family after the procedure. Patient tolerated the procedure without difficulty was ambulating out of the ED without difficulty. Patient verbalized understanding and agreement today's plan and had no further questions or concerns at the time of discharge  Eyvonne MechanicJeffrey Vennie Salsbury, PA-C 06/04/15 2241  Gwyneth SproutWhitney Plunkett, MD 06/04/15 [phone]

## 2015-06-04 NOTE — Discharge Instructions (Signed)
Peritonsillar Abscess A peritonsillar abscess is a collection of yellowish-white fluid (pus) in the back of the throat behind the tonsils. It usually occurs when an infection of the throat or tonsils (tonsillitis) spreads into the tissues around the tonsils. CAUSES The infection that leads to a peritonsillar abscess is usually caused by streptococcal bacteria.  SIGNS AND SYMPTOMS  Sore throat, often with pain on just one side.  Swelling and tenderness of the glands (lymph nodes) in the neck.  Difficulty swallowing.  Difficulty opening your mouth.  Fever.  Chills.  Drooling because of difficulty swallowing saliva.  Headache.  Changes in your voice.  Bad breath. DIAGNOSIS Your health care provider will take your medical history and do a physical exam. Imaging tests may be done, such as an ultrasound or CT scan. A sample of pus may be removed from the abscess using a needle (needle aspiration) or by swabbing the back of your throat. This sample will be sent to a lab for testing. TREATMENT Treatment usually involves draining the pus from the abscess. This may be done through needle aspiration or by making an incision in the abscess. You will also likely need to take antibiotic medicine. HOME CARE INSTRUCTIONS  Rest as much as possible and get plenty of sleep.  Take medicines only as directed by your health care provider.  If you were prescribed an antibiotic medicine, finish it all even if you start to feel better.  If your abscess was drained by your health care provider, gargle with a mixture of salt and warm water:  Mix 1 tsp of salt in 8 oz of warm water.  Gargle with this mixture four times per day or as needed for comfort.  Do not swallow this mixture.  Drink plenty of fluids.  While your throat is sore, eat soft or liquid foods, such as frozen ice pops and ice cream.  Keep all follow-up visits as directed by your health care provider. This is important. SEEK  MEDICAL CARE IF:  You have increased pain, swelling, redness, or drainage in your throat.  You develop a headache, a lack of energy (lethargy), or generalized feelings of illness.  You have a fever.  You feel dizzy.  You have difficulty swallowing or eating.  You show signs of becoming dehydrated, such as:  Light-headedness when standing.  Decreased urine output.  A fast heart rate.  Dry mouth. SEEK IMMEDIATE MEDICAL CARE IF:   You have difficulty talking or breathing, or you find it easier to breathe when you lean forward.  You are coughing up blood or vomiting blood.  You have severe throat pain that is not helped by medicines.  You start to drool.   This information is not intended to replace advice given to you by your health care provider. Make sure you discuss any questions you have with your health care provider.   Document Released: 06/25/2005 Document Revised: 07/16/2014 Document Reviewed: 02/08/2014 Elsevier Interactive Patient Education 2016 [employer]   Please contact Dr. Avel Sensoreoh's office Monday morning to schedule appointment on Thursday. Return immediately if any new or worsening signs or symptoms present.

## 2017-03-05 IMAGING — CT CT NECK W/ CM
3 of 4 series · 13 of 33 positions shown, 16 images · IV contrast (omnipaque)
Comparison: None.

CLINICAL DATA: Throat swelling.  Difficulty swallowing.

EXAM:
CT NECK WITH CONTRAST
TECHNIQUE: Multidetector CT imaging of the neck was performed using the
standard protocol following the bolus administration of intravenous
contrast.
CONTRAST:  75mL OMNIPAQUE IOHEXOL 300 MG/ML  SOLN

[Series 2: soft tissue neck 2.0 b31s · axial · 0.43mm/px · z∈[+58,+222]mm · 5 of 124 slices shown, 7 images]
[im 21/124  soft-tissue]
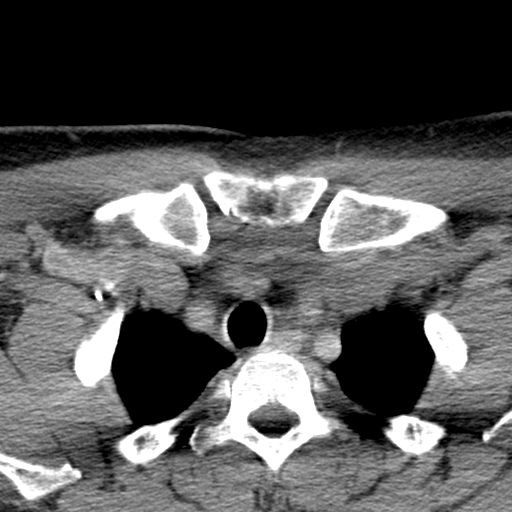
[im 21/124  bone]
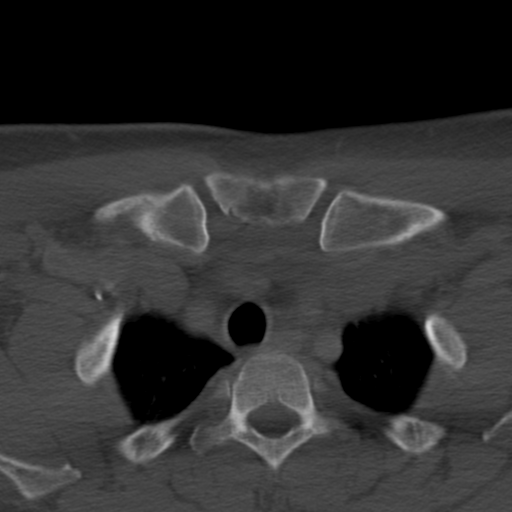
[im 42/124  bone]
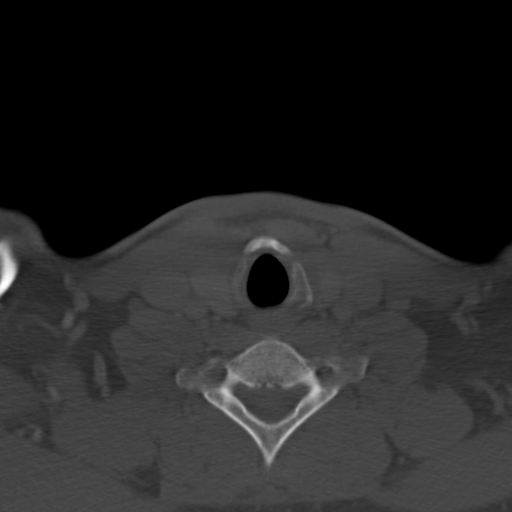
[im 62/124  bone]
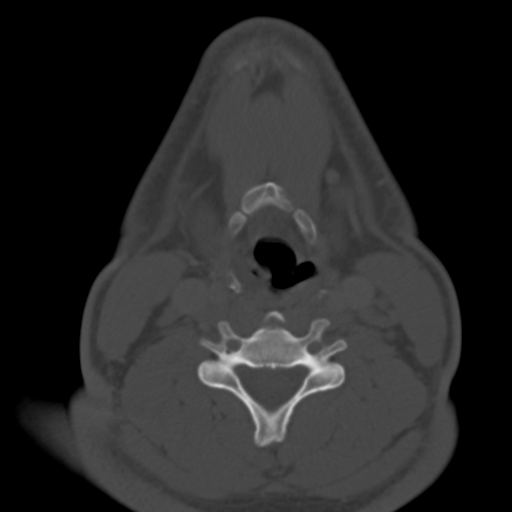
[im 83/124  bone]
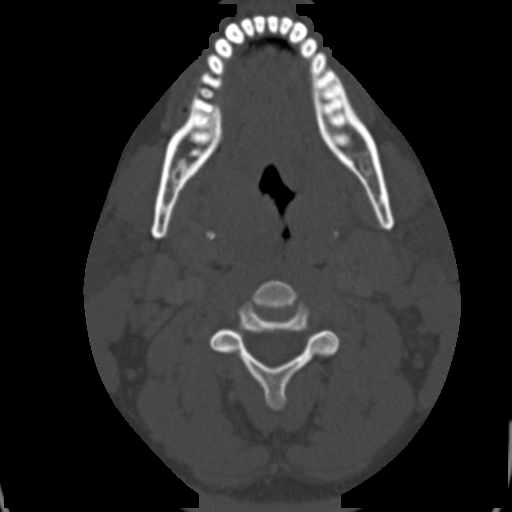
[im 103/124  soft-tissue]
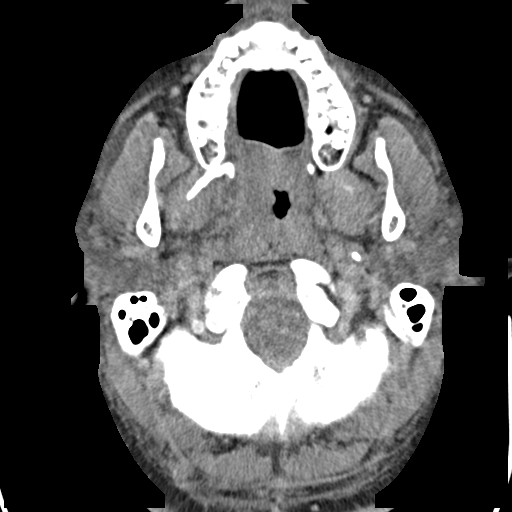
[im 103/124  bone]
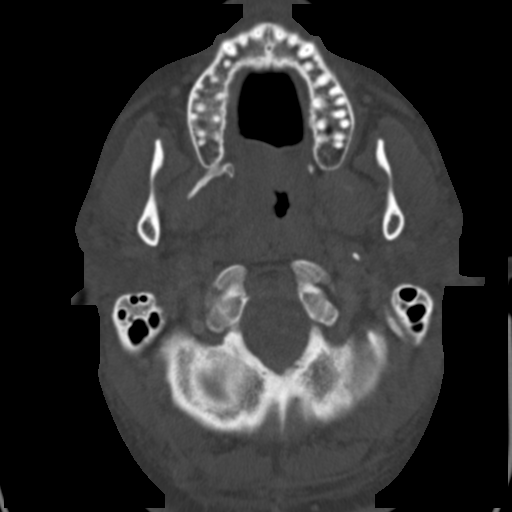

[Series 4: neck 2.0 soft tissue sag · sagittal · 0.42mm/px · 5 of 92 slices shown, 6 images]
[im 31/92  bone]
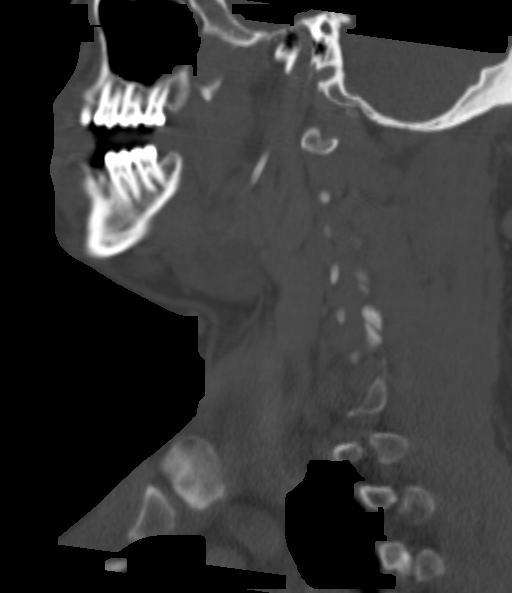
[im 38/92  bone]
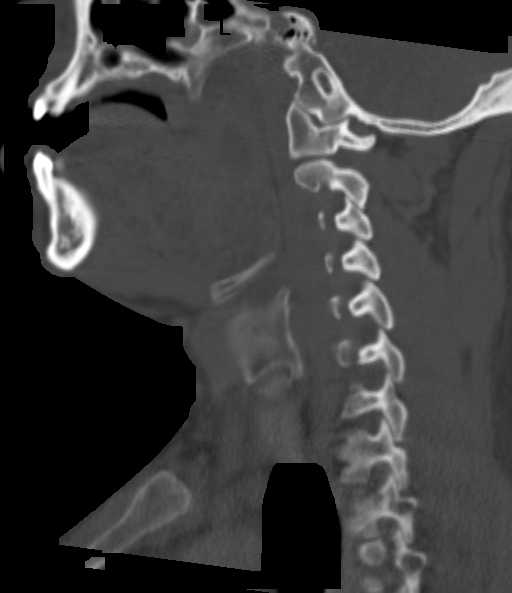
[im 46/92  soft-tissue]
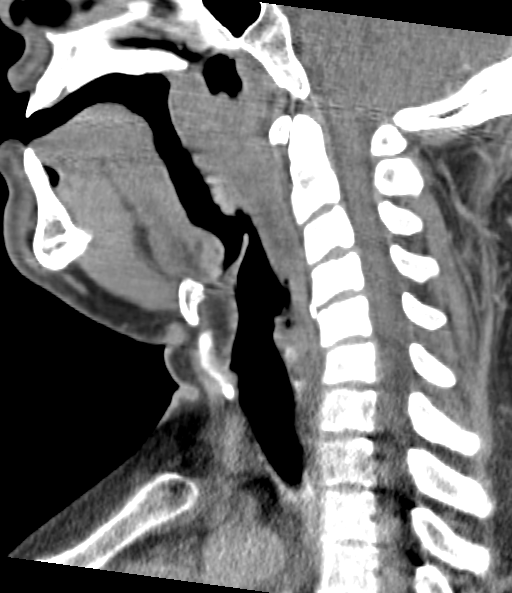
[im 46/92  bone]
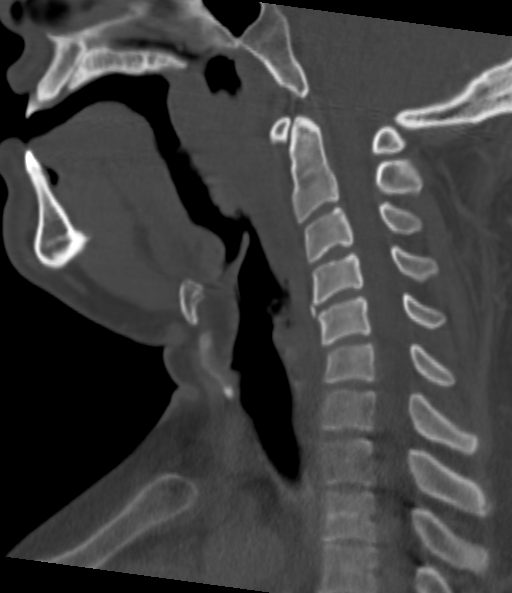
[im 54/92  bone]
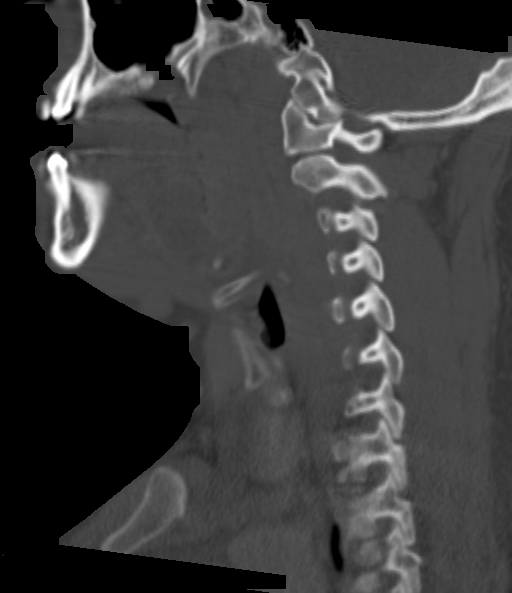
[im 61/92  bone]
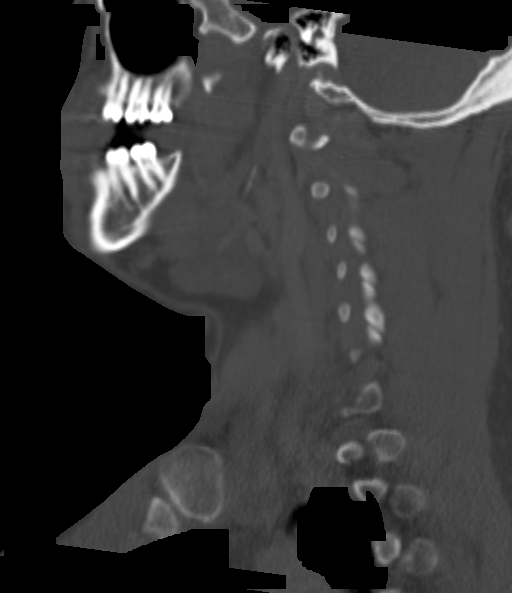

[Series 5: neck 2.0 soft tissue coro · coronal · 0.36mm/px · 3 of 99 slices shown]
[im 20/99  bone]
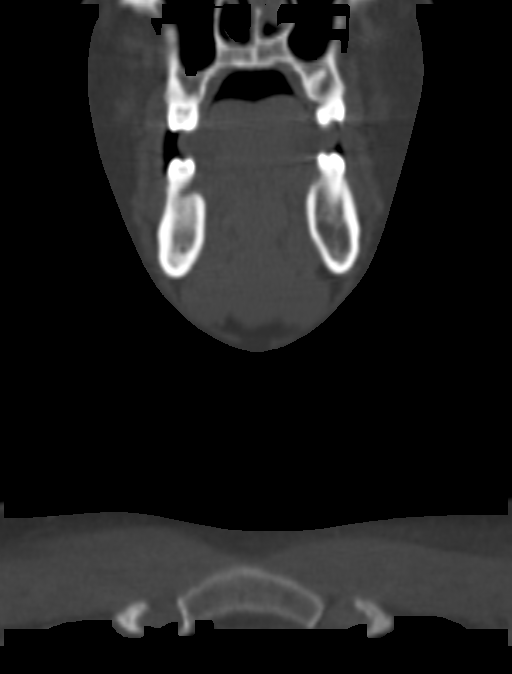
[im 40/99  bone]
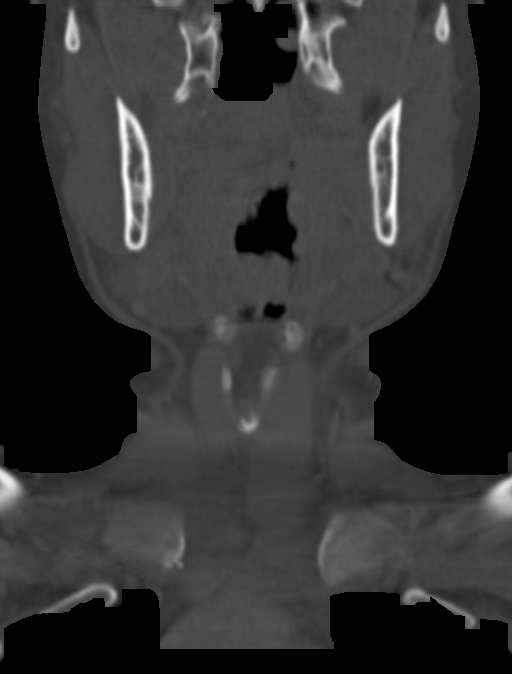
[im 59/99  bone]
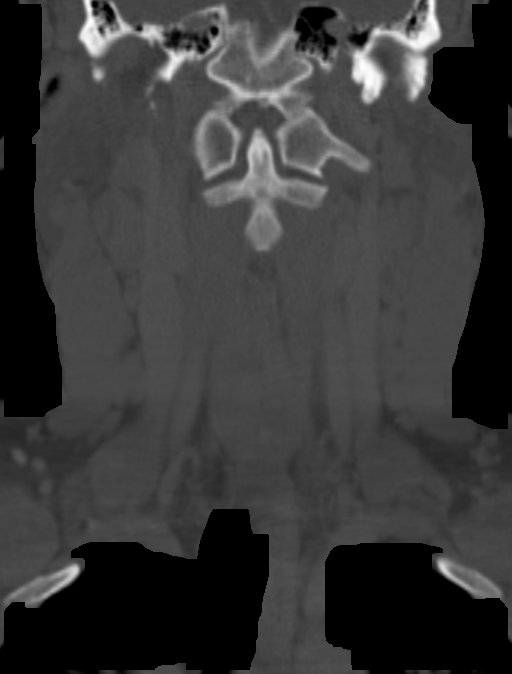

[13 of 33 positions shown; findings below may reference images not displayed]

FINDINGS: Pharynx and larynx: Swelling of the soft palate. Mild adenoidal
prominence and considerable prominence of the palatine tonsils with
a 1.9 by 1.2 by 0.7 cm hypodensity in the right tonsillar pillar
potentially reflecting phlegmon orally abscess. The lingual tonsils
are also hypertrophic/ prominent.

The epiglottis and ariepiglottic folds appear normal. Although the
nasopharyngeal airway is close due to swelling, the or pharyngeal
airway still patent on image 47 series 4.

Swelling along the right quadrangular membrane/posterior pharyngeal
wall with mild effacement of the right piriform sinus. The posterior
pharyngeal wall on the right is hypodense suspicious for phlegmon
tracking within the right posterior pharyngeal wall.

Salivary glands: Unremarkable

Thyroid: Unremarkable

Lymph nodes: Reactive adenopathy bilaterally, left station 2 lymph
node 1.9 cm in short axis on image 48 series 2, right station 2
lymph node 2.1 cm in short axis on image 46 series 2.

Vascular: Unremarkable

Limited intracranial: Unremarkable

Visualized orbits: Unremarkable

Mastoids and visualized paranasal sinuses: Mild chronic right
maxillary sinusitis.

Skeleton: Large cavity of tooth #3 with periapical lucency.

Upper chest: Unremarkable
IMPRESSION: 1. Right tonsillar pillar abscess versus prominent phlegmon.
Hypodense swelling in the right posterior pharyngeal wall compatible
with phlegmon tracking caudad. Prominent swelling of the soft palate
and tonsillar pillars causing obstruction of the nasopharyngeal
airway. Or pharyngeal airway is still patent. Mildly effaced right
piriform sinus due to the posterior pharyngeal wall swelling.
Reactive adenopathy in the neck. Otolaryngology consultation may be
warranted. Careful monitoring for stridor warranted.
2. Note is also made of a large cavity and probable periapical
abscess involving tooth #3. I do not see a direct connection between
this in the pharyngeal findings.
3. Hypertrophic lingual tonsils.
4. Mild chronic right maxillary sinusitis.

## 2017-03-14 ENCOUNTER — Emergency Department (HOSPITAL_COMMUNITY)
Admission: EM | Admit: 2017-03-14 | Discharge: 2017-03-15 | Disposition: A | Discharge status: Home / Self Care | Payer: BLUE CROSS/BLUE SHIELD | Attending: Emergency Medicine | Admitting: Emergency Medicine

## 2017-03-14 ENCOUNTER — Encounter (HOSPITAL_COMMUNITY): Payer: Self-pay | Admitting: Emergency Medicine

## 2017-03-14 DIAGNOSIS — Z8619 Personal history of other infectious and parasitic diseases: Secondary | ICD-10-CM | POA: Diagnosis not present

## 2017-03-14 DIAGNOSIS — Z711 Person with feared health complaint in whom no diagnosis is made: Secondary | ICD-10-CM | POA: Insufficient documentation

## 2017-03-14 DIAGNOSIS — Z79899 Other long term (current) drug therapy: Secondary | ICD-10-CM | POA: Insufficient documentation

## 2017-03-14 DIAGNOSIS — Z113 Encounter for screening for infections with a predominantly sexual mode of transmission: Secondary | ICD-10-CM | POA: Insufficient documentation

## 2017-03-14 DIAGNOSIS — F17210 Nicotine dependence, cigarettes, uncomplicated: Secondary | ICD-10-CM | POA: Insufficient documentation

## 2017-03-14 MED ORDER — PENICILLIN G BENZATHINE 1200000 UNIT/2ML IM SUSP
2.4000 10*6.[IU] | Freq: Once | INTRAMUSCULAR | Status: AC
  Administered 2017-03-14: 2.4 10*6.[IU] via INTRAMUSCULAR
  Filled 2017-03-14: qty 4

## 2017-03-14 NOTE — ED Triage Notes (Signed)
Pt states he went to give plasma today, but unable due to syphilis. Pt was advised to come be treated for std. Pt denies any symptoms at this time.

## 2017-03-14 NOTE — Discharge Instructions (Signed)
Please read the instructions below. Talk with your primary care provider about any new medications. You will receive a call from the hospital if your test results come back positive. Avoid sexual activity until you know your test results. If your results come back positive, it is important that you inform all of your sexual partners. Follow-up with health Department about her recent diagnosis. Return to the ER for new or worsening symptoms.

## 2017-03-14 NOTE — ED Provider Notes (Signed)
[facility]EMERGENCY DEPT Provider Note   CSN: [id number] Arrival date & time: 03/14/17  2234     History   Chief Complaint Chief Complaint  Patient presents with  . SEXUALLY TRANSMITTED DISEASE    HPI David Barron is a 31 y.o. male without significant PMHx, presents to the ED for syphilis treatment. 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 He states he has not been sexually active for a few months, for which she was having protected sex with women. He denies any symptoms today, including fever, urinary symptoms, penile discharge or pain, testicular pain or swelling, abdominal pain, rash. Patient states he would like an HIV test today as well, however declines GC/chlamydia testing.  The history is provided by the patient.    Past Medical History:  Diagnosis Date  . Peritonsillar abscess     There are no active problems to display for this patient.   History reviewed. No pertinent surgical history.     Home Medications    Prior to Admission medications   Medication Sig Start Date End Date Taking? Authorizing Provider  clindamycin (CLEOCIN) 300 MG capsule Take 1 capsule (300 mg total) by mouth 4 (four) times daily. 06/04/15   Hedges, Tinnie Gens, PA-C  HYDROcodone-acetaminophen (NORCO/VICODIN) 5-325 MG tablet Take 2 tablets by mouth every 4 (four) hours as needed. Patient not taking: Reported on 06/04/2015 05/23/15   Elson Areas, PA-C  ibuprofen (ADVIL,MOTRIN) 200 MG tablet Take 400 mg by mouth every 6 (six) hours as needed for moderate pain.    [provider]  naproxen sodium (ALEVE) 220 MG tablet Take 220 mg by mouth 2 (two) times daily as needed (pain).    [provider]  oxyCODONE-acetaminophen (PERCOCET/ROXICET) 5-325 MG tablet Take 1 tablet by mouth every 6 (six) hours as needed for severe pain. 06/04/15   Hedges, Tinnie Gens, PA-C  phenol (CHLORASEPTIC) 1.4 % LIQD Use as directed 1 spray in  the mouth or throat as needed for throat irritation / pain.    [provider]    Family History No family history on file.  Social History Social History  Substance Use Topics  . Smoking status: Current Some Day Smoker    Types: Cigarettes  . Smokeless tobacco: Never Used  . Alcohol use Yes     Comment: social     Allergies   Patient has no known allergies.   Review of Systems Review of Systems  Constitutional: Negative for chills and fever.  Gastrointestinal: Negative for abdominal pain and nausea.  Genitourinary: Negative for discharge, dysuria, penile pain, penile swelling, scrotal swelling and testicular pain.  Musculoskeletal: Negative for arthralgias.  Skin: Negative for rash.       No genital lesions     Physical Exam Updated Vital Signs BP (!) 156/91 (BP Location: Right Arm)   Pulse 93   Temp 98.3 F (36.8 C) (Oral)   Resp 18   Ht 6' (1.829 m)   Wt 136.1 kg (300 lb)   SpO2 97%   BMI 40.69 kg/m   Physical Exam  Constitutional: He appears well-developed and well-nourished. No distress.  HENT:  Head: Normocephalic and atraumatic.  Eyes: Conjunctivae are normal.  Cardiovascular: Normal rate, regular rhythm, normal heart sounds and intact distal pulses.   Pulmonary/Chest: Effort normal and breath sounds normal. No respiratory distress. He has no wheezes. He has no rales.  Abdominal: Soft. Bowel sounds are normal.  Psychiatric: He has  a normal mood and affect. His behavior is normal.  Nursing note and vitals reviewed.    ED Treatments / Results  Labs (all labs ordered are listed, but only abnormal results are displayed) Labs Reviewed  HIV ANTIBODY (ROUTINE TESTING)    EKG  EKG Interpretation None       Radiology No results found.  Procedures Procedures (including critical care time)  Medications Ordered in ED Medications  penicillin g benzathine (BICILLIN LA) 1200000 UNIT/2ML injection 2.4 Million Units (2.4 Million Units  Intramuscular Given 03/14/17 2350)     Initial Impression / Assessment and Plan / ED Course  I have reviewed the triage vital signs and the nursing notes.  Pertinent labs & imaging results that were available during my care of the patient were reviewed by me and considered in my medical decision making (see chart for details).     Patient presenting for treatment of syphilis with recent positive test. Patient without symptoms or medical complaints today. HIV sent. Pt declined GC/chlamydia testing today. Pt treated with PCN G for syphilis. Advised patient to follow up with the health department, and inform all sexual partners so that they can receive treatment. Patient is safe for discharge home.   Discussed results, findings, treatment and follow up. Patient advised of return precautions. Patient verbalized understanding and agreed with plan.  Final Clinical Impressions(s) / ED Diagnoses   Final diagnoses:  History of positive serological reaction for syphilis    New Prescriptions New Prescriptions   No medications on file     Russo, [place] N, PA-C 03/14/17 2352    Glynn Octaveancour, Stephen, MD 03/15/17 [phone]

## 2017-03-16 LAB — HIV ANTIBODY (ROUTINE TESTING W REFLEX): HIV SCREEN 4TH GENERATION: NONREACTIVE

## 2019-04-17 ENCOUNTER — Encounter (HOSPITAL_COMMUNITY): Payer: Self-pay

## 2019-04-17 ENCOUNTER — Emergency Department (HOSPITAL_COMMUNITY)
Admission: EM | Admit: 2019-04-17 | Discharge: 2019-04-17 | Disposition: A | Discharge status: Home / Self Care | Payer: Managed Care, Other (non HMO) | Attending: Emergency Medicine | Admitting: Emergency Medicine

## 2019-04-17 ENCOUNTER — Other Ambulatory Visit: Payer: Self-pay

## 2019-04-17 DIAGNOSIS — Z87891 Personal history of nicotine dependence: Secondary | ICD-10-CM | POA: Diagnosis not present

## 2019-04-17 DIAGNOSIS — J36 Peritonsillar abscess: Secondary | ICD-10-CM | POA: Diagnosis not present

## 2019-04-17 DIAGNOSIS — J029 Acute pharyngitis, unspecified: Secondary | ICD-10-CM | POA: Diagnosis present

## 2019-04-17 LAB — GROUP A STREP BY PCR: Group A Strep by PCR: NOT DETECTED

## 2019-04-17 MED ORDER — DEXAMETHASONE SODIUM PHOSPHATE 10 MG/ML IJ SOLN
10.0000 mg | Freq: Once | INTRAMUSCULAR | Status: AC
  Administered 2019-04-17: 14:00:00 10 mg via INTRAVENOUS
  Filled 2019-04-17: qty 1

## 2019-04-17 MED ORDER — CLINDAMYCIN PHOSPHATE 900 MG/50ML IV SOLN
900.0000 mg | Freq: Once | INTRAVENOUS | Status: AC
  Administered 2019-04-17: 900 mg via INTRAVENOUS
  Filled 2019-04-17: qty 50

## 2019-04-17 MED ORDER — CLINDAMYCIN HCL 300 MG PO CAPS: 300.0000 mg | ORAL_CAPSULE | Freq: Four times a day (QID) | ORAL | 0 refills | Status: DC

## 2019-04-17 MED ORDER — HYDROCODONE-ACETAMINOPHEN 5-325 MG PO TABS: ORAL_TABLET | ORAL | 0 refills | Status: DC

## 2019-04-17 NOTE — ED Provider Notes (Signed)
[facility] EMERGENCY DEPARTMENT Provider Note   CSN: [id number] Arrival date & time: 04/17/19  1058     History   Chief Complaint Chief Complaint  Patient presents with  . Sore Throat    HPI David Barron is a 33 y.o. male.     HPI   David Barron is a 33 y.o. male who presents to the Emergency Department complaining of sore throat that has been gradually worsening for one week.  He complains of pain mostly to the right side of his throat and pain is worse with swallowing.  He had similar symptoms 4 years ago with a peritonsillar abscess that was ultimately drained in the ER.  He denies fever, cough, neck pain, abdominal pain and difficulty breathing.  He reports decreased appetite and only tolerating small sips of fluids.  He denies COVID exposures.    Past Medical History:  Diagnosis Date  . Peritonsillar abscess     There are no active problems to display for this patient.   History reviewed. No pertinent surgical history.      Home Medications    Prior to Admission medications   Medication Sig Start Date End Date Taking? Authorizing Provider  clindamycin (CLEOCIN) 300 MG capsule Take 1 capsule (300 mg total) by mouth 4 (four) times daily. 06/04/15   Hedges, Tinnie Gens, PA-C  HYDROcodone-acetaminophen (NORCO/VICODIN) 5-325 MG tablet Take 2 tablets by mouth every 4 (four) hours as needed. Patient not taking: Reported on 06/04/2015 05/23/15   Elson Areas, PA-C  ibuprofen (ADVIL,MOTRIN) 200 MG tablet Take 400 mg by mouth every 6 (six) hours as needed for moderate pain.    [provider]  naproxen sodium (ALEVE) 220 MG tablet Take 220 mg by mouth 2 (two) times daily as needed (pain).    [provider]  oxyCODONE-acetaminophen (PERCOCET/ROXICET) 5-325 MG tablet Take 1 tablet by mouth every 6 (six) hours as needed for severe pain. 06/04/15   Hedges, Tinnie Gens, PA-C  phenol (CHLORASEPTIC) 1.4 % LIQD Use as directed 1 spray in the mouth or  throat as needed for throat irritation / pain.    [provider]    Family History No family history on file.  Social History Social History   Tobacco Use  . Smoking status: Former Smoker    Types: Cigarettes  . Smokeless tobacco: Never Used  Substance Use Topics  . Alcohol use: Yes    Comment: social  . Drug use: No     Allergies   Patient has no known allergies.   Review of Systems Review of Systems  Constitutional: Positive for appetite change. Negative for activity change, chills and fever.  HENT: Positive for sore throat and voice change. Negative for congestion, ear pain, facial swelling and trouble swallowing.   Respiratory: Negative for cough and shortness of breath.   Cardiovascular: Negative for chest pain.  Gastrointestinal: Negative for abdominal pain, nausea and vomiting.  Musculoskeletal: Negative for arthralgias, neck pain and neck stiffness.  Skin: Negative for color change and rash.  Neurological: Negative for dizziness, facial asymmetry, speech difficulty, numbness and headaches.  Hematological: Negative for adenopathy.     Physical Exam Updated Vital Signs BP (!) 154/98 (BP Location: Right Arm)   Pulse 77   Temp 98.4 F (36.9 C) (Oral)   Resp 18   Ht 6\' 1"  (1.854 m)   Wt (!) 145.2 kg   SpO2 99%   BMI 42.22 kg/m   Physical Exam Vitals signs and nursing note  reviewed.  Constitutional:      General: He is not in acute distress.    Appearance: He is well-developed. He is not ill-appearing or toxic-appearing.  HENT:     Head: Normocephalic.     Jaw: No trismus.     Right Ear: Tympanic membrane and ear canal normal.     Left Ear: Tympanic membrane and ear canal normal.     Mouth/Throat:     Mouth: Mucous membranes are moist.     Pharynx: Uvula midline. Pharyngeal swelling, oropharyngeal exudate and posterior oropharyngeal erythema present. No uvula swelling.     Tonsils: No tonsillar abscesses.     Comments: Unilateral bulging  and fluctuance of the right soft palate.  Uvula is non edematous and remains midline.  No exudates.  Voice is muffled Neck:     Musculoskeletal: Normal range of motion. No muscular tenderness.  Cardiovascular:     Rate and Rhythm: Normal rate and regular rhythm.     Pulses: Normal pulses.     Heart sounds: Normal heart sounds.  Pulmonary:     Effort: Pulmonary effort is normal.     Breath sounds: Normal breath sounds. No stridor.  Abdominal:     Palpations: There is no splenomegaly.     Tenderness: There is no abdominal tenderness.  Musculoskeletal: Normal range of motion.  Lymphadenopathy:     Cervical: Cervical adenopathy present.  Skin:    General: Skin is warm and dry.  Neurological:     Mental Status: He is alert and oriented to person, place, and time.     Motor: No abnormal muscle tone.     Coordination: Coordination normal.      ED Treatments / Results  Labs (all labs ordered are listed, but only abnormal results are displayed) Labs Reviewed  GROUP A STREP BY PCR    EKG None  Radiology No results found.  Procedures Procedures (including critical care time)  Medications Ordered in ED Medications  clindamycin (CLEOCIN) IVPB 900 mg (has no administration in time range)  dexamethasone (DECADRON) injection 10 mg (has no administration in time range)     Initial Impression / Assessment and Plan / ED Course  I have reviewed the triage vital signs and the nursing notes.  Pertinent labs & imaging results that were available during my care of the patient were reviewed by me and considered in my medical decision making (see chart for details).    Pt with sore throat with unilateral edema of the soft palate.  Voice is muffled.  Clinically, pt likely has recurrent peritonsillar abscess.  He is well appearing, no stridor and handling own secretions well.      1300  Consulted Dr. Benjamine Mola and discussed findings.  He recommends IV clindamycin and Decadron.  Rx for outpt  clindamycin x 10 days.  He will see in his office for f/u next week  Pt is resting comfortably.  Appears appropriate for d/c home.  Agrees to plan.  Return precautions discussed.   Final Clinical Impressions(s) / ED Diagnoses   Final diagnoses:  Peritonsillar abscess    ED Discharge Orders    None       Kem Parkinson, PA-C 04/17/19 2043    Francine Graven, DO 04/22/19 2347

## 2019-04-17 NOTE — Discharge Instructions (Addendum)
Take the antibiotic as directed until its finished.  Drink plenty of fluids.  Call Dr. Deeann Saint office to arrange a follow-up appointment for next week.  Return to ER for any worsening symptoms.

## 2019-04-17 NOTE — ED Triage Notes (Signed)
Pt presents to ED with complaints of sore throat x 1 week and painful to swallow. Pt denies fever or exposure to Covid-19.

## 2019-04-19 ENCOUNTER — Encounter (HOSPITAL_COMMUNITY): Payer: Self-pay | Admitting: Emergency Medicine

## 2019-04-19 ENCOUNTER — Emergency Department (HOSPITAL_COMMUNITY): Payer: Managed Care, Other (non HMO)

## 2019-04-19 ENCOUNTER — Other Ambulatory Visit: Payer: Self-pay

## 2019-04-19 ENCOUNTER — Emergency Department (HOSPITAL_COMMUNITY)
Admission: EM | Admit: 2019-04-19 | Discharge: 2019-04-19 | Disposition: A | Discharge status: Home / Self Care | Payer: Managed Care, Other (non HMO) | Attending: Emergency Medicine | Admitting: Emergency Medicine

## 2019-04-19 DIAGNOSIS — Z87891 Personal history of nicotine dependence: Secondary | ICD-10-CM | POA: Insufficient documentation

## 2019-04-19 DIAGNOSIS — Z20828 Contact with and (suspected) exposure to other viral communicable diseases: Secondary | ICD-10-CM | POA: Insufficient documentation

## 2019-04-19 DIAGNOSIS — J36 Peritonsillar abscess: Secondary | ICD-10-CM | POA: Insufficient documentation

## 2019-04-19 LAB — CBC WITH DIFFERENTIAL/PLATELET
Abs Immature Granulocytes: 0.04 10*3/uL (ref 0.00–0.07)
Basophils Absolute: 0 10*3/uL (ref 0.0–0.1)
Basophils Relative: 0 %
Eosinophils Absolute: 0.1 10*3/uL (ref 0.0–0.5)
Eosinophils Relative: 1 %
HCT: 44.3 % (ref 39.0–52.0)
Hemoglobin: 13.6 g/dL (ref 13.0–17.0)
Immature Granulocytes: 0 %
Lymphocytes Relative: 32 %
Lymphs Abs: 3.5 10*3/uL (ref 0.7–4.0)
MCH: 27.5 pg (ref 26.0–34.0)
MCHC: 30.7 g/dL (ref 30.0–36.0)
MCV: 89.5 fL (ref 80.0–100.0)
Monocytes Absolute: 0.9 10*3/uL (ref 0.1–1.0)
Monocytes Relative: 8 %
Neutro Abs: 6.4 10*3/uL (ref 1.7–7.7)
Neutrophils Relative %: 59 %
Platelets: 388 10*3/uL (ref 150–400)
RBC: 4.95 MIL/uL (ref 4.22–5.81)
RDW: 13.6 % (ref 11.5–15.5)
WBC: 10.9 10*3/uL — ABNORMAL HIGH (ref 4.0–10.5)
nRBC: 0 % (ref 0.0–0.2)

## 2019-04-19 LAB — BASIC METABOLIC PANEL
Anion gap: 10 (ref 5–15)
BUN: 11 mg/dL (ref 6–20)
CO2: 25 mmol/L (ref 22–32)
Calcium: 9.3 mg/dL (ref 8.9–10.3)
Chloride: 104 mmol/L (ref 98–111)
Creatinine, Ser: 1.31 mg/dL — ABNORMAL HIGH (ref 0.61–1.24)
GFR calc Af Amer: >60 mL/min (ref >60)
GFR calc non Af Amer: >60 mL/min (ref >60)
Glucose, Bld: 99 mg/dL (ref 70–99)
Potassium: 3.9 mmol/L (ref 3.5–5.1)
Sodium: 139 mmol/L (ref 135–145)

## 2019-04-19 LAB — SARS CORONAVIRUS 2 BY RT PCR (HOSPITAL ORDER, PERFORMED IN ~~LOC~~ HOSPITAL LAB): SARS Coronavirus 2: NEGATIVE

## 2019-04-19 MED ORDER — HYDROMORPHONE HCL 1 MG/ML IJ SOLN
1.0000 mg | Freq: Once | INTRAMUSCULAR | Status: AC
  Administered 2019-04-19: 1 mg via INTRAVENOUS
  Filled 2019-04-19: qty 1

## 2019-04-19 MED ORDER — SODIUM CHLORIDE 0.9 % IV BOLUS
1000.0000 mL | Freq: Once | INTRAVENOUS | Status: AC
  Administered 2019-04-19: 1000 mL via INTRAVENOUS

## 2019-04-19 MED ORDER — FENTANYL CITRATE (PF) 100 MCG/2ML IJ SOLN
50.0000 ug | Freq: Once | INTRAMUSCULAR | Status: AC
  Administered 2019-04-19: 50 ug via INTRAVENOUS
  Filled 2019-04-19: qty 2

## 2019-04-19 MED ORDER — IOHEXOL 300 MG/ML  SOLN
75.0000 mL | Freq: Once | INTRAMUSCULAR | Status: AC | PRN
  Administered 2019-04-19: 75 mL via INTRAVENOUS

## 2019-04-19 MED ORDER — DEXAMETHASONE SODIUM PHOSPHATE 10 MG/ML IJ SOLN
20.0000 mg | Freq: Once | INTRAMUSCULAR | Status: AC
  Administered 2019-04-19: 20 mg via INTRAVENOUS

## 2019-04-19 MED ORDER — SODIUM CHLORIDE 0.9 % IV SOLN
3.0000 g | Freq: Once | INTRAVENOUS | Status: AC
  Administered 2019-04-19: 3 g via INTRAVENOUS
  Filled 2019-04-19: qty 8

## 2019-04-19 MED ORDER — DEXAMETHASONE SODIUM PHOSPHATE 10 MG/ML IJ SOLN
10.0000 mg | Freq: Once | INTRAMUSCULAR | Status: DC
  Filled 2019-04-19: qty 1

## 2019-04-19 MED ORDER — LIDOCAINE-EPINEPHRINE (PF) 2 %-1:200000 IJ SOLN
INTRAMUSCULAR | Status: AC
  Administered 2019-04-19: 19:00:00
  Filled 2019-04-19: qty 20

## 2019-04-19 MED ORDER — PREDNISONE 20 MG PO TABS: 40.0000 mg | ORAL_TABLET | Freq: Every day | ORAL | 0 refills | Status: AC

## 2019-04-19 NOTE — ED Notes (Signed)
Report to [facility]

## 2019-04-19 NOTE — ED Notes (Addendum)
Pt is aware he is to be transferred to [facility]   He signs transfer   His mother Mardene Celeste [phone] is called and informed

## 2019-04-19 NOTE — ED Notes (Signed)
ENT cart, spinal needle, control syring, Lidocaine with epi at bedside at EDP request.

## 2019-04-19 NOTE — ED Provider Notes (Signed)
1700: Patient transferred from any [facility] ER for ENT evaluation of worsening right peritonsillar abscess. Physical Exam  BP (!) 153/93   Pulse (!) 56   Temp 98.7 F (37.1 C) (Oral)   Resp 19   Ht 6\' 1"  (1.854 m)   Wt (!) 145.2 kg   SpO2 94%   BMI 42.22 kg/m   Physical Exam Constitutional:      Appearance: He is well-developed.  HENT:     Head: Normocephalic.     Nose: Nose normal.     Mouth/Throat:     Comments: Only able to visualize top of tonsils, uvula and soft palate due to trismus.  Markedly enlarged tonsils bilaterally.  Uvula appears to be midline on my exam.  2 finger trismus.  Normal sublingual space.  Moist mucous membranes.  Tolerating secretions.  No stridor. Eyes:     General: Lids are normal.  Neck:     Musculoskeletal: Normal range of motion.  Cardiovascular:     Rate and Rhythm: Normal rate.  Pulmonary:     Effort: Pulmonary effort is normal. No respiratory distress.  Musculoskeletal: Normal range of motion.  Neurological:     Mental Status: He is alert.  Psychiatric:        Behavior: Behavior normal.     ED Course/Procedures     Procedures  MDM    1700: I contacted ENT Dr. Benjamine Mola who is on route to ER for evaluation.  Recommended giving 20 mg Decadron, pain medicine, Unasyn.  He is on route to evaluate patient.  Will ask RN to place necessary equipment at bedside for him.  1920: Dr Benjamine Mola has I&D peritonsillar abscess approx 3 cc purulent drainage obtained.  Recommends dc with clindamycin. Will dc with prednisone, NSAIDs, warm gargles. Return precautions given. Pt to call his office tomorrow morning to schedule f/u this week. Discussed plan with patient who is in agreement.      Kinnie Feil, PA-C 04/19/19 Doran Heater    Veryl Speak, MD 04/19/19 Karl Bales

## 2019-04-19 NOTE — ED Notes (Signed)
Family member to room  

## 2019-04-19 NOTE — ED Notes (Signed)
Discharge instructions and prescription discussed with Pt. Pt verbalized understanding. Pt stable and ambulatory.    

## 2019-04-19 NOTE — Consult Note (Signed)
Reason for Consult: Right peritonsillar abscess Referring Physician: Kem Parkinson, PA-C  HPI:  David Barron is an 33 y.o. male who presents to the Emergency Department today complaining of worsening right-sided throat pain.  He was seen at [facility] on 04/17/2019 and determined to have a peritonsillar abscess.  He was given IV antibiotics and Decadron with some improvement of his symptoms at time of discharge.  He states he has been taking oral clindamycin 4 times daily, but the pain and swelling of his throat has gotten worse.  He states he is able to tolerate secretions, but is unable to swallow foods or much liquids.  He denies fever, vomiting, chills and chest tightness. His CT shows a 2cm right peritonsillar abscess. He previously underwent PTA drainage 4 years ago.  Past Medical History:  Diagnosis Date  . Peritonsillar abscess     History reviewed. No pertinent surgical history.  History reviewed. No pertinent family history.  Social History:  reports that he has quit smoking. His smoking use included cigarettes. He has never used smokeless tobacco. He reports current alcohol use. He reports that he does not use drugs.  Allergies: No Known Allergies  Prior to Admission medications   Medication Sig Start Date End Date Taking? Authorizing Provider  clindamycin (CLEOCIN) 300 MG capsule Take 1 capsule (300 mg total) by mouth 4 (four) times daily. Patient not taking: Reported on 04/19/2019 04/17/19   Kem Parkinson, PA-C  HYDROcodone-acetaminophen (NORCO/VICODIN) 5-325 MG tablet Take one tab po q 4 hrs prn pain Patient not taking: Reported on 04/19/2019 04/17/19   Triplett, Tammy, PA-C  oxyCODONE-acetaminophen (PERCOCET/ROXICET) 5-325 MG tablet Take 1 tablet by mouth every 6 (six) hours as needed for severe pain. Patient not taking: Reported on 04/19/2019 06/04/15   Hedges, Dellis Filbert, PA-C  predniSONE (DELTASONE) 20 MG tablet Take 2 tablets (40 mg total) by mouth daily for 7 days.  04/19/19 04/26/19  Kinnie Feil, PA-C    Results for orders placed or performed during the hospital encounter of 04/19/19 (from the past 48 hour(s))  CBC with Differential     Status: Abnormal   Collection Time: 04/19/19 11:30 AM  Result Value Ref Range   WBC 10.9 (H) 4.0 - 10.5 K/uL   RBC 4.95 4.22 - 5.81 MIL/uL   Hemoglobin 13.6 13.0 - 17.0 g/dL   HCT 44.3 39.0 - 52.0 %   MCV 89.5 80.0 - 100.0 fL   MCH 27.5 26.0 - 34.0 pg   MCHC 30.7 30.0 - 36.0 g/dL   RDW 13.6 11.5 - 15.5 %   Platelets 388 150 - 400 K/uL   nRBC 0.0 0.0 - 0.2 %   Neutrophils Relative % 59 %   Neutro Abs 6.4 1.7 - 7.7 K/uL   Lymphocytes Relative 32 %   Lymphs Abs 3.5 0.7 - 4.0 K/uL   Monocytes Relative 8 %   Monocytes Absolute 0.9 0.1 - 1.0 K/uL   Eosinophils Relative 1 %   Eosinophils Absolute 0.1 0.0 - 0.5 K/uL   Basophils Relative 0 %   Basophils Absolute 0.0 0.0 - 0.1 K/uL   Immature Granulocytes 0 %   Abs Immature Granulocytes 0.04 0.00 - 0.07 K/uL    Comment: Performed at [facility], [address] [place], [place] [address]  Basic metabolic panel     Status: Abnormal   Collection Time: 04/19/19 11:30 AM  Result Value Ref Range   Sodium 139 135 - 145 mmol/L   Potassium 3.9 3.5 - 5.1  mmol/L   Chloride 104 98 - 111 mmol/L   CO2 25 22 - 32 mmol/L   Glucose, Bld 99 70 - 99 mg/dL   BUN 11 6 - 20 mg/dL   Creatinine, Ser 5.72 (H) 0.61 - 1.24 mg/dL   Calcium 9.3 8.9 - 62.0 mg/dL   GFR calc non Af Amer >60 >60 mL/min   GFR calc Af Amer >60 >60 mL/min   Anion gap 10 5 - 15    Comment: Performed at [facility], [address], [place], [place] [address]  SARS Coronavirus 2 by RT PCR (hospital order, performed in [facility] hospital lab) Nasopharyngeal Nasopharyngeal Swab     Status: None   Collection Time: 04/19/19 11:57 AM   Specimen: Nasopharyngeal Swab  Result Value Ref Range   SARS Coronavirus 2 NEGATIVE NEGATIVE    Comment: (NOTE) If result is NEGATIVE SARS-CoV-2 target nucleic acids are  NOT DETECTED. The SARS-CoV-2 RNA is generally detectable in upper and lower  respiratory specimens during the acute phase of infection. The lowest  concentration of SARS-CoV-2 viral copies this assay can detect is 250  copies / mL. A negative result does not preclude SARS-CoV-2 infection  and should not be used as the sole basis for treatment or other  patient management decisions.  A negative result may occur with  improper specimen collection / handling, submission of specimen other  than nasopharyngeal swab, presence of viral mutation(s) within the  areas targeted by this assay, and inadequate number of viral copies  (<250 copies / mL). A negative result must be combined with clinical  observations, patient history, and epidemiological information. If result is POSITIVE SARS-CoV-2 target nucleic acids are DETECTED. The SARS-CoV-2 RNA is generally detectable in upper and lower  respiratory specimens dur ing the acute phase of infection.  Positive  results are indicative of active infection with SARS-CoV-2.  Clinical  correlation with patient history and other diagnostic information is  necessary to determine patient infection status.  Positive results do  not rule out bacterial infection or co-infection with other viruses. If result is PRESUMPTIVE POSTIVE SARS-CoV-2 nucleic acids MAY BE PRESENT.   A presumptive positive result was obtained on the submitted specimen  and confirmed on repeat testing.  While 2019 novel coronavirus  (SARS-CoV-2) nucleic acids may be present in the submitted sample  additional confirmatory testing may be necessary for epidemiological  and / or clinical management purposes  to differentiate between  SARS-CoV-2 and other Sarbecovirus currently known to infect humans.  If clinically indicated additional testing with an alternate test  methodology [phone]) is advised. The SARS-CoV-2 RNA is generally  detectable in upper and lower respiratory sp ecimens  during the acute  phase of infection. The expected result is Negative. Fact Sheet for Patients:  BoilerBrush.com.cy Fact Sheet for Healthcare Providers: https://pope.com/ This test is not yet approved or cleared by the [place] FDA and has been authorized for detection and/or diagnosis of SARS-CoV-2 by FDA under an Emergency Use Authorization (EUA).  This EUA will remain in effect (meaning this test can be used) for the duration of the COVID-19 declaration under Section 564(b)(1) of the Act, 21 U.S.C. section 360bbb-3(b)(1), unless the authorization is terminated or revoked sooner. Performed at [facility], [address] [place], [place] [address]     Ct Soft Tissue Neck W Contrast  Result Date: 04/19/2019 CLINICAL DATA:  Right peritonsillar abscess. Multiple old lace with difficulty swallowing. EXAM: CT NECK WITH CONTRAST TECHNIQUE: Multidetector CT imaging  of the neck was performed using the standard protocol following the bolus administration of intravenous contrast. CONTRAST:  75mL OMNIPAQUE IOHEXOL 300 MG/ML  SOLN COMPARISON:  05/24/2015 FINDINGS: Pharynx and larynx: There is marked asymmetric enlargement of the right palatine tonsil with near complete effacement of the upper oropharyngeal airway and with edema extending more inferiorly in the right oropharynx. A vague region of mildly decreased density in the central to lateral aspect of the right tonsil measures approximately 2.5 x 1.3 x 1.9 cm (series 2, image 34 and series 4, image 61). There is no retropharyngeal fluid collection. The larynx is unremarkable. Salivary glands: No inflammation, mass, or stone. Thyroid: Unremarkable. Lymph nodes: Mild bilateral cervical lymphadenopathy, likely reactive with level II a lymph nodes measuring up to 1.7 cm in short axis bilaterally. Vascular: Unremarkable. Limited intracranial: Unremarkable. Visualized orbits: Unremarkable. Mastoids and  visualized paranasal sinuses: Minimal mucosal thickening in the right maxillary sinus. Clear mastoid air cells. Skeleton: Similar appearance of large cavity and mild periapical lucency involving the right mandibular first molar tooth without evidence of acute inflammation. Upper chest: Clear lung apices. Other: None. IMPRESSION: 1. Right-sided tonsillitis with 2.5 cm region of focal phlegmon versus early abscess. 2. Mild bilateral cervical lymphadenopathy, likely reactive. Electronically Signed   By: Sebastian AcheAllen  Grady M.D.   On: 04/19/2019 13:45   Review of Systems  Constitutional: Positive for appetite change. Negative for activity change, chills and fever.  HENT: Positive for sore throat, trouble swallowing and voice change. Negative for congestion, ear pain and facial swelling.   Eyes: Negative for pain and visual disturbance.  Respiratory: Negative for cough, shortness of breath, wheezing and stridor.   Cardiovascular: Negative for chest pain.  Gastrointestinal: Negative for abdominal pain, diarrhea, nausea and vomiting.  Musculoskeletal: Negative for neck pain and neck stiffness.  Skin: Negative for color change and rash.  Neurological: Negative for facial asymmetry, numbness and headaches.  Hematological: Negative for adenopathy.   Blood pressure (!) 153/93, pulse (!) 56, temperature 98.7 F (37.1 C), temperature source Oral, resp. rate 19, height 6\' 1"  (1.854 m), weight (!) 145.2 kg, SpO2 94 %. Physical Exam Vitals signs and nursing note reviewed.  Constitutional:  He is not toxic-appearing.  Patient is uncomfortable appearing but awake and alert. Eyes: His pupils are equal, round, reactive to light. Extraocular motion is intact.  Ears: Examination of the ears shows normal auricles and external auditory canals bilaterally. Both tympanic membranes are intact.  Nose: Nasal examination shows normal mucosa, septum, turbinates.  Face: Facial examination shows no asymmetry. Palpation of the face  elicit no significant tenderness.  Mouth: Significant bulging of the right soft palate.  Moderate erythema. Uvula is significantly deviated to the left. Voice is muffled Neck: No mass or lesion. Normal range of motion. No muscular tenderness.  Cardiovascular:  Normal rate and regular rhythm.  Pulmonary: Pulmonary effort is normal.  Musculoskeletal: Normal range of motion.  Skin: Skin is warm. Capillary refill takes less than 2 seconds.  Neurological: No focal deficit present.  He is alert.   Procedure: Incision and drainage of the right peritonsillar abscess.   Anesthesia: local anesthesia with 1% lidocaine with epinephrine.   Description: The patient is placed upright on the hospital bed.  After adequate local anesthesia is achieved, an 18-G spinal needle is used to make multiple passes over the right superior tonsillar pole.  Approximately 3 cc of purulent fluid was evacuated.  The abscess cavity was incised opened with the spinal needle tip.  The  patient tolerated the procedure well.    Assessment/Plan: Right peritonsillar abscess.  - I&D performed at bedside. - Continue clindamycin QID for 10 days. - Follow up in my office in one week. 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  Capers Hagmann W Michela Herst 04/19/2019, 7:24 PM

## 2019-04-19 NOTE — ED Provider Notes (Signed)
[facility] EMERGENCY DEPARTMENT Provider Note   CSN: [id number] Arrival date & time: 04/19/19  1058     History   Chief Complaint Chief Complaint  Patient presents with  . Oral Swelling    HPI David Barron is a 33 y.o. male.     HPI   David Barron is a 33 y.o. male who presents to the Emergency Department complaining of worsening of right-sided throat pain.  He was seen here on 04/17/2019 and determined to have a peritonsillar abscess.  He was given IV antibiotics and Decadron with some improvement of his symptoms at time of discharge.  He states he has been taking oral clindamycin 4 times daily, but the pain and swelling of his throat has gotten worse.  He states he is able to tolerate secretions, but is unable to swallow foods or much liquids.  He denies fever, vomiting, chills and chest tightness  Past Medical History:  Diagnosis Date  . Peritonsillar abscess     There are no active problems to display for this patient.   History reviewed. No pertinent surgical history.    Home Medications    Prior to Admission medications   Medication Sig Start Date End Date Taking? Authorizing Provider  clindamycin (CLEOCIN) 300 MG capsule Take 1 capsule (300 mg total) by mouth 4 (four) times daily. Patient not taking: Reported on 04/19/2019 04/17/19   Pauline Aus, PA-C  HYDROcodone-acetaminophen (NORCO/VICODIN) 5-325 MG tablet Take one tab po q 4 hrs prn pain Patient not taking: Reported on 04/19/2019 04/17/19   Karynn Deblasi, PA-C  oxyCODONE-acetaminophen (PERCOCET/ROXICET) 5-325 MG tablet Take 1 tablet by mouth every 6 (six) hours as needed for severe pain. Patient not taking: Reported on 04/19/2019 06/04/15   Eyvonne Mechanic, PA-C    Family History History reviewed. No pertinent family history.  Social History Social History   Tobacco Use  . Smoking status: Former Smoker    Types: Cigarettes  . Smokeless tobacco: Never Used  Substance Use Topics  .  Alcohol use: Yes    Comment: social  . Drug use: No     Allergies   Patient has no known allergies.   Review of Systems Review of Systems  Constitutional: Positive for appetite change. Negative for activity change, chills and fever.  HENT: Positive for sore throat, trouble swallowing and voice change. Negative for congestion, ear pain and facial swelling.   Eyes: Negative for pain and visual disturbance.  Respiratory: Negative for cough, shortness of breath, wheezing and stridor.   Cardiovascular: Negative for chest pain.  Gastrointestinal: Negative for abdominal pain, diarrhea, nausea and vomiting.  Musculoskeletal: Negative for neck pain and neck stiffness.  Skin: Negative for color change and rash.  Neurological: Negative for facial asymmetry, numbness and headaches.  Hematological: Negative for adenopathy.     Physical Exam Updated Vital Signs BP (!) 148/95   Pulse (!) 59   Temp 98.7 F (37.1 C) (Oral)   Resp 19   Ht 6\' 1"  (1.854 m)   Wt (!) 145.2 kg   SpO2 99%   BMI 42.22 kg/m   Physical Exam Vitals signs and nursing note reviewed.  Constitutional:      Appearance: He is not toxic-appearing.     Comments: Patient is uncomfortable appearing and tearful.  HENT:     Head: Normocephalic.     Mouth/Throat:     Mouth: Mucous membranes are moist.     Pharynx: Posterior oropharyngeal erythema present.     Comments:  Significant bulging of the right soft palate.  Moderate erythema without significant exudate.  Uvula is significantly deviated to the left.  Patient is handling secretions. Voice is muffled Neck:     Musculoskeletal: Normal range of motion. No muscular tenderness.  Cardiovascular:     Rate and Rhythm: Normal rate and regular rhythm.     Pulses: Normal pulses.  Pulmonary:     Effort: Pulmonary effort is normal.     Breath sounds: No stridor.  Abdominal:     General: There is no distension.     Palpations: Abdomen is soft.     Tenderness: There is no  abdominal tenderness.  Musculoskeletal: Normal range of motion.  Lymphadenopathy:     Cervical: No cervical adenopathy.  Skin:    General: Skin is warm.     Capillary Refill: Capillary refill takes less than 2 seconds.     Findings: No rash.  Neurological:     General: No focal deficit present.     Mental Status: He is alert.     Sensory: No sensory deficit.     Motor: No weakness.      ED Treatments / Results  Labs (all labs ordered are listed, but only abnormal results are displayed) Labs Reviewed  CBC WITH DIFFERENTIAL/PLATELET - Abnormal; Notable for the following components:      Result Value   WBC 10.9 (*)    All other components within normal limits  BASIC METABOLIC PANEL - Abnormal; Notable for the following components:   Creatinine, Ser 1.31 (*)    All other components within normal limits  SARS CORONAVIRUS 2 BY RT PCR (HOSPITAL ORDER, [facility])    EKG None  Radiology Ct Soft Tissue Neck W Contrast  Result Date: 04/19/2019 CLINICAL DATA:  Right peritonsillar abscess. Multiple old lace with difficulty swallowing. EXAM: CT NECK WITH CONTRAST TECHNIQUE: Multidetector CT imaging of the neck was performed using the standard protocol following the bolus administration of intravenous contrast. CONTRAST:  75mL OMNIPAQUE IOHEXOL 300 MG/ML  SOLN COMPARISON:  05/24/2015 FINDINGS: Pharynx and larynx: There is marked asymmetric enlargement of the right palatine tonsil with near complete effacement of the upper oropharyngeal airway and with edema extending more inferiorly in the right oropharynx. A vague region of mildly decreased density in the central to lateral aspect of the right tonsil measures approximately 2.5 x 1.3 x 1.9 cm (series 2, image 34 and series 4, image 61). There is no retropharyngeal fluid collection. The larynx is unremarkable. Salivary glands: No inflammation, mass, or stone. Thyroid: Unremarkable. Lymph nodes: Mild bilateral  cervical lymphadenopathy, likely reactive with level II a lymph nodes measuring up to 1.7 cm in short axis bilaterally. Vascular: Unremarkable. Limited intracranial: Unremarkable. Visualized orbits: Unremarkable. Mastoids and visualized paranasal sinuses: Minimal mucosal thickening in the right maxillary sinus. Clear mastoid air cells. Skeleton: Similar appearance of large cavity and mild periapical lucency involving the right mandibular first molar tooth without evidence of acute inflammation. Upper chest: Clear lung apices. Other: None. IMPRESSION: 1. Right-sided tonsillitis with 2.5 cm region of focal phlegmon versus early abscess. 2. Mild bilateral cervical lymphadenopathy, likely reactive. Electronically Signed   By: Sebastian AcheAllen  Grady M.D.   On: 04/19/2019 13:45    Procedures Procedures (including critical care time)  Medications Ordered in ED Medications  sodium chloride 0.9 % bolus 1,000 mL (0 mLs Intravenous Stopped 04/19/19 1418)  fentaNYL (SUBLIMAZE) injection 50 mcg (50 mcg Intravenous Given 04/19/19 1146)  iohexol (OMNIPAQUE) 300 MG/ML  solution 75 mL (75 mLs Intravenous Contrast Given 04/19/19 1309)     Initial Impression / Assessment and Plan / ED Course  I have reviewed the triage vital signs and the nursing notes.  Pertinent labs & imaging results that were available during my care of the patient were reviewed by me and considered in my medical decision making (see chart for details).        Pt with worsening right peritonsillar abscess.  He was seen by me on his initial exam, deviation of the uvula today that was not present on previous visit.  He is taking oral clindamycin without relief.  He is handling own secretions at this time.  No stridor  1430  Consulted Dr. Benjamine Mola and discussed findings.  Requesting pt to be transferred to [facility] ER and he will see him there  [place] Dr. Tyrone Nine who accepts pt to [facility] ER   Final Clinical Impressions(s) / ED Diagnoses   Final  diagnoses:  Peritonsillar abscess    ED Discharge Orders    None       Kem Parkinson, PA-C 04/19/19 1552    Fredia Sorrow, MD 04/23/19 1529

## 2019-04-19 NOTE — ED Notes (Signed)
TT in to discuss w pt   He is to be transferred to [facility]

## 2019-04-19 NOTE — ED Notes (Addendum)
Leta Baptist, MD (ENT) at bedside.

## 2019-04-19 NOTE — Discharge Instructions (Signed)
Continue clindamycin capsules at home until fully completed  Take prednisone as prescribed for inflammation  For pain and inflammation you can use a combination of ibuprofen and acetaminophen.  Take [phone] mg acetaminophen (tylenol) every 6 hours or 600 mg ibuprofen (advil, motrin) every 6 hours.  You can take these separately or combine them every 6 hours for maximum pain control. Do not exceed 4,000 mg acetaminophen or 2,400 mg ibuprofen in a 24 hour period.  Do not take ibuprofen containing products if you have history of kidney disease, ulcers, GI bleeding, severe acid reflux, or take a blood thinner.  Do not take acetaminophen if you have liver disease.   Soft diet  Do warm water/salt gargles to help with throat inflammation and pain

## 2019-04-19 NOTE — ED Notes (Signed)
Here 2 days ago for same   IV antibiotics, home on antibiotics   Continues with pain swelling to throat   Here for eval

## 2019-04-19 NOTE — ED Notes (Signed)
Pt reports he was sent to [place] for eval last time he felt this way

## 2019-04-19 NOTE — ED Triage Notes (Signed)
Pt reports dx with peritonsillar abscess on 04/17/19. Had iv abx and decadron and has been taking rx for clindamycin without relief. Voice muffled at triage with difficulty swallowing.

## 2019-04-19 NOTE — ED Notes (Signed)
[facility] en route.  °

## 2019-04-23 ENCOUNTER — Ambulatory Visit (INDEPENDENT_AMBULATORY_CARE_PROVIDER_SITE_OTHER): Payer: Managed Care, Other (non HMO) | Admitting: Otolaryngology

## 2021-10-06 ENCOUNTER — Emergency Department (HOSPITAL_COMMUNITY)
Admission: EM | Admit: 2021-10-06 | Discharge: 2021-10-06 | Disposition: A | Discharge status: Home / Self Care | Payer: Managed Care, Other (non HMO) | Attending: Emergency Medicine | Admitting: Emergency Medicine

## 2021-10-06 ENCOUNTER — Other Ambulatory Visit: Payer: Self-pay

## 2021-10-06 ENCOUNTER — Encounter (HOSPITAL_COMMUNITY): Payer: Self-pay

## 2021-10-06 DIAGNOSIS — Z28310 Unvaccinated for covid-19: Secondary | ICD-10-CM | POA: Insufficient documentation

## 2021-10-06 DIAGNOSIS — R059 Cough, unspecified: Secondary | ICD-10-CM | POA: Insufficient documentation

## 2021-10-06 DIAGNOSIS — R0981 Nasal congestion: Secondary | ICD-10-CM | POA: Insufficient documentation

## 2021-10-06 DIAGNOSIS — R6889 Other general symptoms and signs: Secondary | ICD-10-CM

## 2021-10-06 DIAGNOSIS — R6883 Chills (without fever): Secondary | ICD-10-CM | POA: Insufficient documentation

## 2021-10-06 DIAGNOSIS — Z20822 Contact with and (suspected) exposure to covid-19: Secondary | ICD-10-CM | POA: Insufficient documentation

## 2021-10-06 LAB — RESP PANEL BY RT-PCR (FLU A&B, COVID) ARPGX2
Influenza A by PCR: NEGATIVE
Influenza B by PCR: NEGATIVE
SARS Coronavirus 2 by RT PCR: NEGATIVE

## 2021-10-06 NOTE — ED Triage Notes (Signed)
Reports chills, congestion. Loss of taste x 4 days ?

## 2021-10-06 NOTE — ED Provider Notes (Signed)
?[facility] ?Provider Note ? ? ?CSN: [id number] ?Arrival date & time: 10/06/21  1505 ? ?  ? ?History ? ?Chief Complaint  ?Patient presents with  ? Chills  ? ? ?David Barron is a 36 y.o. male who presents today for evaluation of 4 days of cough that is intermittently productive, congestion, and chills.  He denies fevers, nausea, or vomiting.  He states he has not been vaccinated against COVID, denies any nose COVID or sick contacts.  He has not had COVID previously per his report. ? ?He denies any shortness of breath.  No chest pain. ? ?HPI ? ?  ? ?Home Medications ?Prior to Admission medications   ?Medication Sig Start Date End Date Taking? Authorizing Provider  ?clindamycin (CLEOCIN) 300 MG capsule Take 1 capsule (300 mg total) by mouth 4 (four) times daily. ?Patient not taking: Reported on 04/19/2019 04/17/19   Kem Parkinson, PA-C  ?HYDROcodone-acetaminophen (NORCO/VICODIN) 5-325 MG tablet Take one tab po q 4 hrs prn pain ?Patient not taking: Reported on 04/19/2019 04/17/19   Kem Parkinson, PA-C  ?oxyCODONE-acetaminophen (PERCOCET/ROXICET) 5-325 MG tablet Take 1 tablet by mouth every 6 (six) hours as needed for severe pain. ?Patient not taking: Reported on 04/19/2019 06/04/15   Okey Regal, PA-C  ?   ? ?Allergies    ?Patient has no known allergies.   ? ?Review of Systems   ?Review of Systems ? ?Physical Exam ?Updated Vital Signs ?BP 131/89 (BP Location: Right Arm)   Pulse 96   Temp 99.1 ?F (37.3 ?C) (Oral)   Resp 18   Ht 6\' 1"  (1.854 m)   Wt (!) 152.1 kg   SpO2 94%   BMI 44.24 kg/m?  ?Physical Exam ?Vitals and nursing note reviewed.  ?Constitutional:   ?   General: He is not in acute distress. ?   Appearance: He is not ill-appearing.  ?HENT:  ?   Head: Atraumatic.  ?Eyes:  ?   Conjunctiva/sclera: Conjunctivae normal.  ?Cardiovascular:  ?   Rate and Rhythm: Normal rate and regular rhythm.  ?Pulmonary:  ?   Effort: Pulmonary effort is normal. No respiratory distress.  ?    Breath sounds: Normal breath sounds. No wheezing or rhonchi.  ?Abdominal:  ?   General: There is no distension.  ?Musculoskeletal:  ?   Cervical back: Normal range of motion and neck supple.  ?   Comments: No obvious acute injury  ?Skin: ?   General: Skin is warm.  ?Neurological:  ?   Mental Status: He is alert.  ?   Comments: Awake and alert, answers all questions appropriately.  Speech is not slurred.  ?Psychiatric:     ?   Mood and Affect: Mood normal.     ?   Behavior: Behavior normal.  ? ? ?ED Results / Procedures / Treatments   ?Labs ?(all labs ordered are listed, but only abnormal results are displayed) ?Labs Reviewed  ?RESP PANEL BY RT-PCR (FLU A&B, COVID) ARPGX2  ? ? ?EKG ?None ? ?Radiology ?No results found. ? ?Procedures ?Procedures  ? ? ?Medications Ordered in ED ?Medications - No data to display ? ?ED Course/ Medical Decision Making/ A&P ?  ?                        ?Medical Decision Making ?David Barron was evaluated in Emergency Department on 10/06/2021 for the symptoms described in the history of present illness. He was evaluated in the context of  the global COVID-19 pandemic, which necessitated consideration that the patient might be at risk for infection with the SARS-CoV-2 virus that causes COVID-19. Institutional protocols and algorithms that pertain to the evaluation of patients at risk for COVID-19 are in a state of rapid change based on information released by regulatory bodies including the CDC and federal and state organizations. These policies and algorithms were followed during the patient's care in the ED. ? ?Patient presents today for evaluation of 4 days of chills, cough and congestion.  On my exam he is 96% on room air lungs are clear bilaterally and in no distress. ?He tested negative for flu and COVID. ?I recommended conservative care including following COVID precautions.  With a negative test he does not qualify for oral antivirals. ?He had had basic labs ordered primarily to  evaluate his eligibility for Paxlovid however when his test came back these were canceled after discussion with patient. ? ?Return precautions were discussed with patient who states their understanding.  At the time of discharge patient denied any unaddressed complaints or concerns.  Patient is agreeable for discharge home. ? ?Note: Portions of this report may have been transcribed using voice recognition software. Every effort was made to ensure accuracy; however, inadvertent computerized transcription errors may be present ? ? ? ? ? ? ?Amount and/or Complexity of Data Reviewed ?Independent Historian:  ?   Details: None avaliable ?External Data Reviewed:  ?   Details: None avaliable ?Labs: ordered. ?   Details: Flu and COVID testing is negative. ? ?Risk ?OTC drugs. ?Decision regarding hospitalization. ? ? ?Final Clinical Impression(s) / ED Diagnoses ?Final diagnoses:  ?Flu-like symptoms  ? ? ?Rx / DC Orders ?ED Discharge Orders   ? ? None  ? ?  ? ? ?  ?Lorin Glass, [place] ?10/06/21 1654 ? ?  ?Davonna Belling, MD ?10/07/21 0017 ? ?

## 2021-10-06 NOTE — Discharge Instructions (Signed)
Today your flu and COVID test were negative. ?I recommend that you follow COVID precautions just in case to help protect those around you. ?If you develop significant shortness of breath, chest pain, or have any new or concerning symptoms please seek additional medical care and evaluation. ? ?As we discussed you can also try treatment for allergies as that can cause a lot of symptoms this time of year. ?I would recommend using nasal steroid sprays such as Flonase or Nasacort daily.  Additionally you can take either Allegra, Claritin, or Zyrtec. ? ?Additionally showering after you are outside to rinse off the pollen can be helpful. ?

## 2024-05-11 ENCOUNTER — Encounter (HOSPITAL_COMMUNITY): Payer: Self-pay | Admitting: Emergency Medicine

## 2024-05-11 ENCOUNTER — Emergency Department (HOSPITAL_COMMUNITY)
Admission: EM | Admit: 2024-05-11 | Discharge: 2024-05-11 | Attending: Emergency Medicine | Admitting: Emergency Medicine

## 2024-05-11 ENCOUNTER — Other Ambulatory Visit: Payer: Self-pay

## 2024-05-11 DIAGNOSIS — R55 Syncope and collapse: Secondary | ICD-10-CM

## 2024-05-11 DIAGNOSIS — I10 Essential (primary) hypertension: Secondary | ICD-10-CM | POA: Insufficient documentation

## 2024-05-11 DIAGNOSIS — R739 Hyperglycemia, unspecified: Secondary | ICD-10-CM

## 2024-05-11 DIAGNOSIS — E1165 Type 2 diabetes mellitus with hyperglycemia: Secondary | ICD-10-CM | POA: Insufficient documentation

## 2024-05-11 LAB — CBG MONITORING, ED: Glucose-Capillary: 206 mg/dL — ABNORMAL HIGH (ref 70–99)

## 2024-05-11 NOTE — ED Triage Notes (Signed)
 Pt BIB [facility] from jail after near syncopal episode. Pt admits to using cocaine tonight along with drinking prior to being arrested. Pt denies syncopal episode. Denies any complaints.

## 2024-05-11 NOTE — ED Provider Notes (Signed)
 [place] EMERGENCY DEPARTMENT AT [facility]  Provider Note  CSN: [id number] Arrival date & time: 05/11/24 0340  History Chief Complaint  Patient presents with   Near Syncope    David Barron is a 38 y.o. male arrives via EMS in police custody. He was in the process of being arrested and had an episode of unresponsiveness while at the jail. He reports he remembers everything that happened and is feeling fine now. 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 EMS reported his glucose was elevated, but vitals were otherwise normal. He reports prior history of DM and HTN but not currently on any medications.    Home Medications Prior to Admission medications   Medication Sig Start Date End Date Taking? Authorizing Provider  clindamycin  (CLEOCIN ) 300 MG capsule Take 1 capsule (300 mg total) by mouth 4 (four) times daily. Patient not taking: Reported on 04/19/2019 04/17/19   Herlinda Milling, PA-C  HYDROcodone -acetaminophen  (NORCO/VICODIN) 5-325 MG tablet Take one tab po q 4 hrs prn pain Patient not taking: Reported on 04/19/2019 04/17/19   Triplett, Tammy, PA-C  oxyCODONE -acetaminophen  (PERCOCET/ROXICET) 5-325 MG tablet Take 1 tablet by mouth every 6 (six) hours as needed for severe pain. Patient not taking: Reported on 04/19/2019 06/04/15   Palmer Purchase, PA-C     Allergies    Patient has no known allergies.   Review of Systems   Review of Systems Please see HPI for pertinent positives and negatives  Physical Exam BP (!) 133/92 (BP Location: Right Arm)   Pulse (!) 108   Temp 98.4 F (36.9 C) (Oral)   Resp 20   Ht 6' 1 (1.854 m)   Wt (!) 152.1 kg   SpO2 93%   BMI 44.24 kg/m   Physical Exam Vitals and nursing note reviewed.  HENT:     Head: Normocephalic.     Nose: Nose normal.  Eyes:     Extraocular Movements: Extraocular movements intact.  Cardiovascular:     Rate and Rhythm: Tachycardia present.  Pulmonary:     Effort: Pulmonary effort is normal.   Abdominal:     General: There is no distension.  Musculoskeletal:        General: Normal range of motion.     Cervical back: Neck supple.  Skin:    Findings: No rash (on exposed skin).  Neurological:     General: No focal deficit present.     Mental Status: He is alert and oriented to person, place, and time.  Psychiatric:        Mood and Affect: Mood normal.     ED Results / Procedures / Treatments   EKG None  Procedures Procedures  Medications Ordered in the ED Medications - No data to display  Initial Impression and Plan  Patient here with an episode of non-specific AMS while being booked into jail. He is back to baseline now, vitals and exam are reassuring. Offered ED evaluation with labs, EKG, CXR etc but he refuses. He was noted to be mildly hyperglycemic with EMS and on arrival here and advised to discuss restarting DM meds if remains elevated after acute episode is resolved. He is otherwise stable and well appearing, he will be discharged into police custody. PCP follow up, RTED for any other concerns.    ED Course       MDM Rules/Calculators/A&P Medical Decision Making Problems Addressed: Hyperglycemia: undiagnosed new problem with uncertain prognosis Near syncope: acute illness or injury  Amount and/or Complexity  of Data Reviewed Labs: ordered. Decision-making details documented in ED Course.     Final Clinical Impression(s) / ED Diagnoses Final diagnoses:  Near syncope  Hyperglycemia    Rx / DC Orders ED Discharge Orders     None        Roselyn Carlin NOVAK, MD 05/11/24 [phone]
